# Patient Record
Sex: Female | Born: 1985
Health system: Southern US, Community
[De-identification: ages and names within clinical notes are randomized; demographics above are authoritative.]

## PROBLEM LIST (undated history)

## (undated) DIAGNOSIS — A749 Chlamydial infection, unspecified: Secondary | ICD-10-CM

## (undated) DIAGNOSIS — Z789 Other specified health status: Secondary | ICD-10-CM

## (undated) HISTORY — PX: ECTOPIC PREGNANCY SURGERY: SHX613

## (undated) HISTORY — PX: IUD REMOVAL: SHX5392

## (undated) HISTORY — PX: TUBAL LIGATION: SHX77

---
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
## 2005-06-28 HISTORY — PX: TUBAL LIGATION: SHX77

## 2011-05-17 ENCOUNTER — Emergency Department (HOSPITAL_COMMUNITY): Payer: Self-pay

## 2011-05-17 ENCOUNTER — Emergency Department (HOSPITAL_COMMUNITY)
Admission: EM | Admit: 2011-05-17 | Discharge: 2011-05-17 | Disposition: A | Discharge status: Home / Self Care | Payer: Self-pay | Attending: Emergency Medicine | Admitting: Emergency Medicine

## 2011-05-17 DIAGNOSIS — W1789XA Other fall from one level to another, initial encounter: Secondary | ICD-10-CM | POA: Insufficient documentation

## 2011-05-17 DIAGNOSIS — S93401A Sprain of unspecified ligament of right ankle, initial encounter: Secondary | ICD-10-CM

## 2011-05-17 DIAGNOSIS — S93409A Sprain of unspecified ligament of unspecified ankle, initial encounter: Secondary | ICD-10-CM | POA: Insufficient documentation

## 2011-05-17 DIAGNOSIS — M79609 Pain in unspecified limb: Secondary | ICD-10-CM | POA: Insufficient documentation

## 2011-05-17 DIAGNOSIS — M25579 Pain in unspecified ankle and joints of unspecified foot: Secondary | ICD-10-CM | POA: Insufficient documentation

## 2011-05-17 MED ORDER — KETOROLAC TROMETHAMINE 60 MG/2ML IM SOLN
60.0000 mg | Freq: Once | INTRAMUSCULAR | Status: AC
  Administered 2011-05-17: 60 mg via INTRAMUSCULAR
  Filled 2011-05-17: qty 2

## 2011-05-17 MED ORDER — IBUPROFEN 800 MG PO TABS: 800.0000 mg | ORAL_TABLET | Freq: Three times a day (TID) | ORAL | Status: AC

## 2011-05-17 MED ORDER — HYDROCODONE-ACETAMINOPHEN 5-325 MG PO TABS: 2.0000 | ORAL_TABLET | ORAL | Status: AC | PRN

## 2011-05-17 MED ORDER — KETOROLAC TROMETHAMINE 60 MG/2ML IM SOLN
INTRAMUSCULAR | Status: AC
  Administered 2011-05-17: 60 mg via INTRAMUSCULAR
  Filled 2011-05-17: qty 2

## 2011-05-17 NOTE — Progress Notes (Signed)
Orthopedic Tech Progress Note Patient Details:  Rose Hunt 07/04/1985 [id number]  Other Ortho Devices Type of Ortho Device: ASO;Crutches   [name] 05/17/2011, 11:09 AM

## 2011-05-17 NOTE — ED Provider Notes (Signed)
History     CSN: [id number] Arrival date & time: 05/17/2011  9:24 AM   First MD Initiated Contact with Patient 05/17/11 [phone]      Chief Complaint  Patient presents with   Foot Pain    rt. foot pain, fell off the bus this morning   HPI Patient seen and evaluated at 10 am. Patient presents to emergency room with complaints of right ankle pain. Reports that this started when she was walking off the bus this morning. Patient states that she was able to walk with pain. Patient denies any other injuries. Patient denies any loss of consciousness. Patient denies any numbness or weakness.  History reviewed. No pertinent past medical history.  History reviewed. No pertinent past surgical history.  Family History  Problem Relation Age of Onset   Diabetes Mother    Hypertension Mother     History  Substance Use Topics   Smoking status: Current Everyday Smoker   Smokeless tobacco: Never Used   Alcohol Use: Yes    OB History    Grav Para Term Preterm Abortions TAB SAB Ect Mult Living                  Review of Systems  Constitutional: Negative for fever, chills, diaphoresis and appetite change.  HENT: Negative for neck pain.   Eyes: Negative for photophobia and visual disturbance.  Respiratory: Negative for cough, chest tightness and shortness of breath.   Cardiovascular: Negative for chest pain.  Gastrointestinal: Negative for nausea, vomiting and abdominal pain.  Genitourinary: Negative for flank pain.  Musculoskeletal: Negative for back pain.  Skin: Negative for rash.  Neurological: Negative for weakness and numbness.  All other systems reviewed and are negative.    Allergies  Review of patient's allergies indicates no known allergies.  Home Medications   Current Outpatient Rx  Name Route Sig Dispense Refill   ACETAMINOPHEN 160 MG PO TABS Oral Take 160 mg by mouth every 6 (six) hours as needed. For pain      BP 115/72   Pulse 87   Temp(Src) 98.3 F (36.8  C) (Oral)   Resp 20   Ht 5\' 4"  (1.626 m)   Wt 240 lb (108.863 kg)   BMI 41.20 kg/m2   SpO2 99%   LMP 05/03/2011  Physical Exam  Nursing note and vitals reviewed. Constitutional: She is oriented to person, place, and time. She appears well-developed and well-nourished. No distress.  HENT:  Head: Normocephalic and atraumatic.  Eyes: EOM are normal. Pupils are equal, round, and reactive to light.  Neck: Normal range of motion. Neck supple.  Cardiovascular: Normal rate and regular rhythm.   Pulmonary/Chest: Effort normal and breath sounds normal.  Abdominal: Soft. Bowel sounds are normal. There is no tenderness.  Musculoskeletal:       Right ankle: She exhibits normal range of motion, no swelling, no deformity and normal pulse. tenderness. Lateral malleolus and medial malleolus tenderness found. No CF ligament, no posterior TFL, no head of 5th metatarsal and no proximal fibula tenderness found.  Neurological: She is alert and oriented to person, place, and time. She has normal reflexes. No cranial nerve deficit or sensory deficit. She exhibits normal muscle tone. Coordination and gait normal.  Skin: Skin is warm and dry. She is not diaphoretic. No pallor.  Psychiatric: She has a normal mood and affect. Her behavior is normal. Judgment and thought content normal.    ED Course  Procedures (including critical care time)  Patient seen  and evaluated.  VSS reviewed. . Nursing notes reviewed. Will monitor the patient closely. They agree with the treatment plan and diagnosis.   No results found for this or any previous visit. Dg Ankle Complete Right  05/17/2011  *RADIOLOGY REPORT*  Clinical Data: Diffuse ankle and heel pain after falling.  RIGHT ANKLE - COMPLETE 3+ VIEW  Comparison: None.  Findings: The mineralization and alignment are normal.  There is no evidence of acute fracture or dislocation.  No focal soft tissue swelling is evident.  The joint spaces are preserved.  IMPRESSION: No acute  osseous findings.  Original Report Authenticated By: Gerrianne Scale, M.D.    10:47 AM Patient seen and re-evaluated. Resting comfortably. VSS stable. NAD. Patient notified of testing results. Stated agreement and understanding. Patient stated understanding to treatment plan and diagnosis. ASO and crutches     MDM  Ankle sprain        Demetrius Charity, PA 05/17/11 1048

## 2011-05-17 NOTE — ED Notes (Signed)
Pt. Larey Seat off the bus this am, and is having rt. Foot pain into rt. Ankle. No deformity noted slightly swollen +CNS

## 2015-01-14 ENCOUNTER — Emergency Department (HOSPITAL_BASED_OUTPATIENT_CLINIC_OR_DEPARTMENT_OTHER)
Admission: EM | Admit: 2015-01-14 | Discharge: 2015-01-14 | Disposition: A | Discharge status: Home / Self Care | Payer: Self-pay | Attending: Emergency Medicine | Admitting: Emergency Medicine

## 2015-01-14 DIAGNOSIS — Z3202 Encounter for pregnancy test, result negative: Secondary | ICD-10-CM | POA: Insufficient documentation

## 2015-01-14 DIAGNOSIS — A5901 Trichomonal vulvovaginitis: Secondary | ICD-10-CM | POA: Insufficient documentation

## 2015-01-14 DIAGNOSIS — R1030 Lower abdominal pain, unspecified: Secondary | ICD-10-CM

## 2015-01-14 DIAGNOSIS — B9689 Other specified bacterial agents as the cause of diseases classified elsewhere: Secondary | ICD-10-CM

## 2015-01-14 DIAGNOSIS — Z720 Tobacco use: Secondary | ICD-10-CM | POA: Insufficient documentation

## 2015-01-14 DIAGNOSIS — N760 Acute vaginitis: Secondary | ICD-10-CM | POA: Insufficient documentation

## 2015-01-14 DIAGNOSIS — R110 Nausea: Secondary | ICD-10-CM | POA: Insufficient documentation

## 2015-01-14 LAB — URINALYSIS, ROUTINE W REFLEX MICROSCOPIC
Bilirubin Urine: NEGATIVE
GLUCOSE, UA: NEGATIVE mg/dL
Hgb urine dipstick: NEGATIVE
KETONES UR: NEGATIVE mg/dL
NITRITE: NEGATIVE
PROTEIN: NEGATIVE mg/dL
Specific Gravity, Urine: 1.022 (ref 1.005–1.030)
UROBILINOGEN UA: 0.2 mg/dL (ref 0.0–1.0)
pH: 6.5 (ref 5.0–8.0)

## 2015-01-14 LAB — URINE MICROSCOPIC-ADD ON

## 2015-01-14 LAB — WET PREP, GENITAL: YEAST WET PREP: NONE SEEN

## 2015-01-14 LAB — PREGNANCY, URINE: PREG TEST UR: NEGATIVE

## 2015-01-14 MED ORDER — KETOROLAC TROMETHAMINE 60 MG/2ML IM SOLN
60.0000 mg | Freq: Once | INTRAMUSCULAR | Status: AC
  Administered 2015-01-14: 60 mg via INTRAMUSCULAR
  Filled 2015-01-14: qty 2

## 2015-01-14 MED ORDER — METRONIDAZOLE 500 MG PO TABS
500.0000 mg | ORAL_TABLET | Freq: Two times a day (BID) | ORAL | Status: DC
Start: 2015-01-14 — End: 2015-02-04

## 2015-01-14 NOTE — ED Notes (Signed)
Lower abdominal pain. Menses is late.

## 2015-01-14 NOTE — ED Provider Notes (Signed)
CSN: [id number]     Arrival date & time 01/14/15  1312 History   First MD Initiated Contact with Patient 01/14/15 1345     Chief Complaint  Patient presents with  . Abdominal Pain     (Consider location/radiation/quality/duration/timing/severity/associated sxs/prior Treatment) HPI Comments: 29 year old female complaining of lower abdominal pain 1 month, worsening over the past week. Pain is described as cramping, with occasional episodes of sharp pain that come and go at random, nonradiating rated 8/10. Tried taking over-the-counter nighttime medication yesterday evening with no relief. States she is about one week late on her menstrual cycle. LMP 12/03/2014. History of tubal ligation. Admits to a small amount of vaginal discharge and increased urinary frequency. Denies dysuria, hematuria or vaginal bleeding. Denies fevers. Admits to nausea without vomiting.  Patient is a 29 y.o. female presenting with abdominal pain. The history is provided by the patient.  Abdominal Pain Associated symptoms: nausea and vaginal discharge     No past medical history on file. No past surgical history on file. Family History  Problem Relation Age of Onset  . Diabetes Mother   . Hypertension Mother    History  Substance Use Topics  . Smoking status: Current Every Day Smoker  . Smokeless tobacco: Never Used  . Alcohol Use: Yes   OB History    No data available     Review of Systems  Gastrointestinal: Positive for nausea and abdominal pain.  Genitourinary: Positive for frequency and vaginal discharge.  All other systems reviewed and are negative.     Allergies  Review of patient's allergies indicates no known allergies.  Home Medications   Prior to Admission medications   Medication Sig Start Date End Date Taking? Authorizing Provider  acetaminophen (TYLENOL) 160 MG tablet Take 160 mg by mouth every 6 (six) hours as needed. For pain    Historical Provider, MD  metroNIDAZOLE (FLAGYL) 500  MG tablet Take 1 tablet (500 mg total) by mouth 2 (two) times daily. One po bid x 7 days 01/14/15   Nada Boozer Tawonda Legaspi, PA-C   BP 119/80 mmHg  Pulse 86  Temp(Src) 99.3 F (37.4 C) (Oral)  Resp 20  Ht  (1.626 m)  Wt 240 lb (108.863 kg)  BMI 41.18 kg/m2  SpO2 100%  LMP 12/03/2014 Physical Exam  Constitutional: She is oriented to person, place, and time. She appears well-developed and well-nourished. No distress.  HENT:  Head: Normocephalic and atraumatic.  Mouth/Throat: Oropharynx is clear and moist.  Eyes: Conjunctivae and EOM are normal.  Neck: Normal range of motion. Neck supple.  Cardiovascular: Normal rate, regular rhythm and normal heart sounds.   Pulmonary/Chest: Effort normal and breath sounds normal. No respiratory distress.  Abdominal: Soft. Normal appearance and bowel sounds are normal. She exhibits no distension. There is tenderness in the suprapubic area. There is no rigidity, no rebound, no guarding and no CVA tenderness.  No peritoneal signs.  Genitourinary: Uterus is tender. Cervix exhibits no motion tenderness, no discharge and no friability. Right adnexum displays no mass and no tenderness. Left adnexum displays no mass and no tenderness. No tenderness or bleeding in the vagina. Vaginal discharge (white) found.  Musculoskeletal: Normal range of motion. She exhibits no edema.  Neurological: She is alert and oriented to person, place, and time. No sensory deficit.  Skin: Skin is warm and dry.  Psychiatric: She has a normal mood and affect. Her behavior is normal.  Nursing note and vitals reviewed.   ED Course  Procedures (including  critical care time) Labs Review Labs Reviewed  WET PREP, GENITAL - Abnormal; Notable for the following:    Trich, Wet Prep MANY (*)    Clue Cells Wet Prep HPF POC MODERATE (*)    WBC, Wet Prep HPF POC MODERATE (*)    All other components within normal limits  URINALYSIS, ROUTINE W REFLEX MICROSCOPIC (NOT AT [facility]) - Abnormal; Notable for  the following:    Leukocytes, UA SMALL (*)    All other components within normal limits  URINE MICROSCOPIC-ADD ON - Abnormal; Notable for the following:    Squamous Epithelial / LPF FEW (*)    Bacteria, UA FEW (*)    All other components within normal limits  PREGNANCY, URINE  GC/CHLAMYDIA PROBE AMP (Bivalve) NOT AT [facility]    Imaging Review No results found.   EKG Interpretation None      MDM   Final diagnoses:  Trichomonas vaginitis  Suprapubic abdominal pain, unspecified laterality   Nontoxic appearing, NAD. Afebrile. Vital signs stable. On chart review, patient was seen 2 months ago at [facility] for the same, at that time had a pelvic ultrasound confirming a 9 mm uterine fibroid, and she was given a shot of Rocephin along with azithromycin in the ED and discharged home with Flagyl and doxycycline. Patient states she did not take the prescribed medications due to cost. She had trichomonas present at the time. Patient states the pain went away for about a month and returned. On exam, patient has a tender uterus. No cervical discharge, CMT or adnexal tenderness. I do not feel repeat ultrasound is necessary today. Many trichomoniasis and moderate clue cells seen on wet prep along with trichomonas present in urine. Will give Rx for Flagyl. I do not feel patient needs prophylactic treatment for GC/chlamydia as she just received this 2 months ago. Cultures pending. Discussed importance of medication compliance. Follow-up with gynecology for evaluation of the uterine fibroid. Stable for discharge. Return precautions given. Patient states understanding of treatment care plan and is agreeable.  Kathrynn SpeedRobyn M Hilaria Titsworth, PA-C 01/14/15 1509  Jerelyn ScottMartha Linker, MD 01/15/15 [phone]

## 2015-01-14 NOTE — Discharge Instructions (Signed)
Trichomonas is a sexually transmitted disease. You're obligated to inform your partner so that he can receive treatment. Take Flagyl twice daily for 1 week as directed. It is important to complete the entire course of antibiotic.  Trichomoniasis Trichomoniasis is an infection caused by an organism called Trichomonas. The infection can affect both women and men. In women, the outer female genitalia and the vagina are affected. In men, the penis is mainly affected, but the prostate and other reproductive organs can also be involved. Trichomoniasis is a sexually transmitted infection (STI) and is most often passed to another person through sexual contact.  RISK FACTORS  Having unprotected sexual intercourse.  Having sexual intercourse with an infected partner. SIGNS AND SYMPTOMS  Symptoms of trichomoniasis in women include:  Abnormal gray-green frothy vaginal discharge.  Itching and irritation of the vagina.  Itching and irritation of the area outside the vagina. Symptoms of trichomoniasis in men include:   Penile discharge with or without pain.  Pain during urination. This results from inflammation of the urethra. DIAGNOSIS  Trichomoniasis may be found during a Pap test or physical exam. Your health care provider may use one of the following methods to help diagnose this infection:  Examining vaginal discharge under a microscope. For men, urethral discharge would be examined.  Testing the pH of the vagina with a test tape.  Using a vaginal swab test that checks for the Trichomonas organism. A test is available that provides results within a few minutes.  Doing a culture test for the organism. This is not usually needed. TREATMENT   You may be given medicine to fight the infection. Women should inform their health care provider if they could be or are pregnant. Some medicines used to treat the infection should not be taken during pregnancy.  Your health care provider may recommend  over-the-counter medicines or creams to decrease itching or irritation.  Your sexual partner will need to be treated if infected. HOME CARE INSTRUCTIONS   Take medicines only as directed by your health care provider.  Take over-the-counter medicine for itching or irritation as directed by your health care provider.  Do not have sexual intercourse while you have the infection.  Women should not douche or wear tampons while they have the infection.  Discuss your infection with your partner. Your partner may have gotten the infection from you, or you may have gotten it from your partner.  Have your sex partner get examined and treated if necessary.  Practice safe, informed, and protected sex.  See your health care provider for other STI testing. SEEK MEDICAL CARE IF:   You still have symptoms after you finish your medicine.  You develop abdominal pain.  You have pain when you urinate.  You have bleeding after sexual intercourse.  You develop a rash.  Your medicine makes you sick or makes you throw up (vomit). MAKE SURE YOU:  Understand these instructions.  Will watch your condition.  Will get help right away if you are not doing well or get worse. Document Released: 12/08/2000 Document Revised: 10/29/2013 Document Reviewed: 03/26/2013 [facility] Patient Information 2015 [employer] [place]. This information is not intended to replace advice given to you by your health care provider. Make sure you discuss any questions you have with your health care provider.

## 2015-01-14 NOTE — ED Notes (Signed)
PA at bedside.

## 2015-01-15 LAB — GC/CHLAMYDIA PROBE AMP (~~LOC~~) NOT AT ARMC
Chlamydia: NEGATIVE
Neisseria Gonorrhea: NEGATIVE

## 2015-02-03 ENCOUNTER — Ambulatory Visit (INDEPENDENT_AMBULATORY_CARE_PROVIDER_SITE_OTHER): Payer: Self-pay | Admitting: Obstetrics & Gynecology

## 2015-02-03 ENCOUNTER — Encounter: Payer: Self-pay | Admitting: Obstetrics & Gynecology

## 2015-02-03 VITALS — BP 120/90 | HR 89 | Ht 64.0 in | Wt 229.8 lb

## 2015-02-03 DIAGNOSIS — N898 Other specified noninflammatory disorders of vagina: Secondary | ICD-10-CM

## 2015-02-03 DIAGNOSIS — B9689 Other specified bacterial agents as the cause of diseases classified elsewhere: Secondary | ICD-10-CM

## 2015-02-03 DIAGNOSIS — A499 Bacterial infection, unspecified: Secondary | ICD-10-CM

## 2015-02-03 DIAGNOSIS — N760 Acute vaginitis: Secondary | ICD-10-CM

## 2015-02-03 NOTE — Progress Notes (Signed)
   CLINIC ENCOUNTER NOTE  History:  29 y.o. [id number] here today for follow up after being evaluated at [facility] on 01/14/15 for pain.  She had recently been treated for Trichomonas at [facility] and PID.  The PA who saw her in the [facility] alluded to ultrasound done at [facility] what showed 9 mm fibroid; patient was sent here for follow up.  Of note, repeat wet prep showed Trichomonas, and she was retreated with Metronidazole 500 mg po bid x 7 days.  She denies any abnormal vaginal bleeding, pelvic pain but reports continued abnormal discharge.  History reviewed. No pertinent past medical history.  Past Surgical History  Procedure Laterality Date  . Tubal ligation  2007   The following portions of the patient's history were reviewed and updated as appropriate: allergies, current medications, past family history, past medical history, past social history, past surgical history and problem list.   Health Maintenance:  Normal pap in 2015.  Review of Systems:  Pertinent items are noted in HPI. Comprehensive review of systems was otherwise negative.  Objective:  Physical Exam BP 120/90 mmHg  Pulse 89  Ht  (1.626 m)  Wt 229 lb 12.8 oz (104.237 kg)  BMI 39.43 kg/m2  LMP 01/07/2015 CONSTITUTIONAL: Well-developed, well-nourished female in no acute distress.  HENT:  Normocephalic, atraumatic. External right and left ear normal. Oropharynx is clear and moist EYES: Conjunctivae and EOM are normal. Pupils are equal, round, and reactive to light. No scleral icterus.  NECK: Normal range of motion, supple, no masses SKIN: Skin is warm and dry. No rash noted. Not diaphoretic. No erythema. No pallor. NEUROLGIC: Alert and oriented to person, place, and time. Normal reflexes, muscle tone coordination. No cranial nerve deficit noted. PSYCHIATRIC: Normal mood and affect. Normal behavior. Normal judgment and thought content. CARDIOVASCULAR: Normal heart rate noted RESPIRATORY: Effort and  breath sounds normal, no problems with respiration noted ABDOMEN: Soft, no distention noted.   PELVIC: Normal appearing external genitalia; normal appearing vaginal mucosa and cervix.  White discharge noted, wet prep obtained.  Normal uterine size, no other palpable masses, no uterine or adnexal tenderness. MUSCULOSKELETAL: Normal range of motion. No edema noted.  Labs and Imaging Wet prep pending  Assessment & Plan:  Will follow up results of wet prep and manage accordingly. Reassured about 9 mm fibroid, no need for intervention now.  Routine preventative health maintenance measures emphasized. Please refer to After Visit Summary for other counseling recommendations.     Jaynie Collins, MD, FACOG Attending Obstetrician & Gynecologist Center for [employer], [facility]

## 2015-02-03 NOTE — Patient Instructions (Signed)
Return to clinic for any scheduled appointments or for any gynecologic concerns as needed.   

## 2015-02-04 ENCOUNTER — Telehealth: Payer: Self-pay | Admitting: *Deleted

## 2015-02-04 DIAGNOSIS — B9689 Other specified bacterial agents as the cause of diseases classified elsewhere: Secondary | ICD-10-CM

## 2015-02-04 DIAGNOSIS — N760 Acute vaginitis: Principal | ICD-10-CM

## 2015-02-04 LAB — WET PREP, GENITAL
Trich, Wet Prep: NONE SEEN
YEAST WET PREP: NONE SEEN

## 2015-02-04 MED ORDER — METRONIDAZOLE 500 MG PO TABS: 500.0000 mg | ORAL_TABLET | Freq: Two times a day (BID) | ORAL | Status: DC

## 2015-02-04 NOTE — Addendum Note (Signed)
Addended by: Jaynie Collins A on: 02/04/2015 08:19 AM   Modules accepted: Orders

## 2015-02-04 NOTE — Telephone Encounter (Signed)
Pt has bacterial vaginitis, prescription ordered by Dr. Macon Large.  Contacted patient, results given and recommendation for medical treatment. Pt states her pharmacy needs to be changed to [facility] on [address]. [place].

## 2015-12-23 ENCOUNTER — Emergency Department (INDEPENDENT_AMBULATORY_CARE_PROVIDER_SITE_OTHER)
Admission: EM | Admit: 2015-12-23 | Discharge: 2015-12-23 | Disposition: A | Discharge status: Home / Self Care | Payer: Self-pay | Source: Home / Self Care | Attending: Family Medicine | Admitting: Family Medicine

## 2015-12-23 ENCOUNTER — Encounter: Payer: Self-pay | Admitting: *Deleted

## 2015-12-23 DIAGNOSIS — S39012A Strain of muscle, fascia and tendon of lower back, initial encounter: Secondary | ICD-10-CM

## 2015-12-23 MED ORDER — ACETAMINOPHEN 325 MG PO TABS
650.0000 mg | ORAL_TABLET | Freq: Once | ORAL | Status: AC
  Administered 2015-12-23: 650 mg via ORAL

## 2015-12-23 MED ORDER — CYCLOBENZAPRINE HCL 10 MG PO TABS: 10.0000 mg | ORAL_TABLET | Freq: Two times a day (BID) | ORAL | Status: DC | PRN

## 2015-12-23 MED ORDER — MELOXICAM 7.5 MG PO TABS: 7.5000 mg | ORAL_TABLET | Freq: Every day | ORAL | Status: DC

## 2015-12-23 NOTE — Discharge Instructions (Signed)
°  Meloxicam (Mobic) is an antiinflammatory to help with pain and inflammation.  Do not take ibuprofen, Advil, Aleve, or any other medications that contain NSAIDs while taking meloxicam as this may cause stomach upset or even ulcers if taken in large amounts for an extended period of time.  ° °

## 2015-12-23 NOTE — ED Provider Notes (Signed)
CSN: [id number]     Arrival date & time 12/23/15  0910 History   First MD Initiated Contact with Patient 12/23/15 0919     Chief Complaint  Patient presents with  . Back Pain   (Consider location/radiation/quality/duration/timing/severity/associated sxs/prior Treatment) HPI  Rose Hunt is a 30 y.o. female presenting to UC with c/o sudden onset mid-lower back pain around 0800 as she was helping a coworker lift and transfer a patient. She has been working this current job for about 8-9 months. Denies prior back injuries or surgeries. Current pain is aching and sore, 7/10 at this time, worse with certain movements and in certain positions.  She has not taken anything PTA.  Denies numbness, tingling or pain in arms or legs.   History reviewed. No pertinent past medical history. Past Surgical History  Procedure Laterality Date  . Tubal ligation  2007   Family History  Problem Relation Age of Onset  . Diabetes Mother   . Hypertension Mother    Social History  Substance Use Topics  . Smoking status: Current Some Day Smoker    Types: Cigarettes  . Smokeless tobacco: Never Used     Comment: 1-2 black and mild cigars q wk  . Alcohol Use: Yes   OB History    Gravida Para Term Preterm AB TAB SAB Ectopic Multiple Living   3 2 2  1 1    2      Review of Systems  Musculoskeletal: Positive for myalgias and back pain. Negative for joint swelling, arthralgias, gait problem, neck pain and neck stiffness.  Skin: Negative for color change and wound.  Neurological: Negative for weakness and numbness.    Allergies  Review of patient's allergies indicates no known allergies.  Home Medications   Prior to Admission medications   Medication Sig Start Date End Date Taking? Authorizing Provider  cyclobenzaprine (FLEXERIL) 10 MG tablet Take 1 tablet (10 mg total) by mouth 2 (two) times daily as needed for muscle spasms. 12/23/15   Junius FinnerErin O'Malley, PA-C  meloxicam (MOBIC) 7.5 MG tablet Take 1-2  tablets (7.5-15 mg total) by mouth daily. For 5 days, then daily as needed for pain 12/23/15   Junius FinnerErin O'Malley, PA-C   Meds Ordered and Administered this Visit   Medications  acetaminophen (TYLENOL) tablet 650 mg (650 mg Oral Given 12/23/15 0925)    BP 117/85 mmHg  Pulse 100  Temp(Src) 98 F (36.7 C) (Oral)  Resp 18  Ht 5\' 4"  (1.626 m)  Wt 240 lb (108.863 kg)  BMI 41.18 kg/m2  SpO2 100%  LMP 12/18/2015 No data found.   Physical Exam  Constitutional: She is oriented to person, place, and time. She appears well-developed and well-nourished.  Sitting comfortably in exam chair, NAD  HENT:  Head: Normocephalic and atraumatic.  Eyes: EOM are normal.  Neck: Normal range of motion. Neck supple.  No midline bone tenderness, no crepitus or step-offs.  Cardiovascular: Normal rate, regular rhythm and normal heart sounds.   Pulmonary/Chest: Effort normal and breath sounds normal. No respiratory distress. She has no wheezes. She has no rales.  Musculoskeletal: Normal range of motion. She exhibits tenderness. She exhibits no edema.  Tenderness to thoracic muscles and bilateral thoracic and lumbar paraspinal muscles and mild tenderness to lumbar spine.  Negative straight leg raise. 5/5 strength in upper and lower extremities bilaterally. Normal gait.   Neurological: She is alert and oriented to person, place, and time.  Skin: Skin is warm and dry. No rash noted. No  erythema.  Psychiatric: She has a normal mood and affect. Her behavior is normal.  Nursing note and vitals reviewed.   ED Course  Procedures (including critical care time)  Labs Review Labs Reviewed - No data to display  Imaging Review No results found.   MDM   1. Back strain, initial encounter    Pt c/o back pain after helping a coworker lift and transfer a patient. No falls. No red flag symptoms.  Tenderness to muscles and mild tenderness to lumbar spine with negative straight leg raise. Will hold off on imaging at this  time and treat symptomatically for muscle strain.  Rx: Meloxicam and Flexeril  Encouraged to use ice today and tomorrow then change to heating back or warm washcloths. Home care instructions provided. F/u with PCP or Sports Medicine in 1 week if not improving. Patient verbalized understanding and agreement with treatment plan.     Junius FinnerErin O'Malley, PA-C 12/23/15 [phone]

## 2015-12-23 NOTE — ED Notes (Signed)
Pt c/o mid back pain and rib area pain bilaterally x 0800 today after helping a coworker lift a pt. No OTC meds.

## 2016-01-19 ENCOUNTER — Encounter (HOSPITAL_BASED_OUTPATIENT_CLINIC_OR_DEPARTMENT_OTHER): Payer: Self-pay

## 2016-01-19 ENCOUNTER — Emergency Department (HOSPITAL_BASED_OUTPATIENT_CLINIC_OR_DEPARTMENT_OTHER)
Admission: EM | Admit: 2016-01-19 | Discharge: 2016-01-19 | Disposition: A | Discharge status: Home / Self Care | Payer: Self-pay | Attending: Emergency Medicine | Admitting: Emergency Medicine

## 2016-01-19 DIAGNOSIS — N760 Acute vaginitis: Secondary | ICD-10-CM | POA: Insufficient documentation

## 2016-01-19 DIAGNOSIS — R1030 Lower abdominal pain, unspecified: Secondary | ICD-10-CM

## 2016-01-19 DIAGNOSIS — F17210 Nicotine dependence, cigarettes, uncomplicated: Secondary | ICD-10-CM | POA: Insufficient documentation

## 2016-01-19 DIAGNOSIS — N72 Inflammatory disease of cervix uteri: Secondary | ICD-10-CM | POA: Insufficient documentation

## 2016-01-19 DIAGNOSIS — B9689 Other specified bacterial agents as the cause of diseases classified elsewhere: Secondary | ICD-10-CM

## 2016-01-19 DIAGNOSIS — A599 Trichomoniasis, unspecified: Secondary | ICD-10-CM | POA: Insufficient documentation

## 2016-01-19 LAB — URINE MICROSCOPIC-ADD ON

## 2016-01-19 LAB — URINALYSIS, ROUTINE W REFLEX MICROSCOPIC
Glucose, UA: NEGATIVE mg/dL
Hgb urine dipstick: NEGATIVE
KETONES UR: 15 mg/dL — AB
NITRITE: NEGATIVE
PH: 6 (ref 5.0–8.0)
Protein, ur: NEGATIVE mg/dL
SPECIFIC GRAVITY, URINE: 1.027 (ref 1.005–1.030)

## 2016-01-19 LAB — WET PREP, GENITAL
Sperm: NONE SEEN
Yeast Wet Prep HPF POC: NONE SEEN

## 2016-01-19 LAB — PREGNANCY, URINE: PREG TEST UR: NEGATIVE

## 2016-01-19 MED ORDER — METRONIDAZOLE 500 MG PO TABS
2000.0000 mg | ORAL_TABLET | Freq: Once | ORAL | Status: AC
  Administered 2016-01-19: 2000 mg via ORAL
  Filled 2016-01-19: qty 4

## 2016-01-19 MED ORDER — CEFTRIAXONE SODIUM 250 MG IJ SOLR
250.0000 mg | Freq: Once | INTRAMUSCULAR | Status: AC
  Administered 2016-01-19: 250 mg via INTRAMUSCULAR
  Filled 2016-01-19: qty 250

## 2016-01-19 MED ORDER — ONDANSETRON 4 MG PO TBDP
4.0000 mg | ORAL_TABLET | Freq: Once | ORAL | Status: AC
  Administered 2016-01-19: 4 mg via ORAL
  Filled 2016-01-19: qty 1

## 2016-01-19 MED ORDER — AZITHROMYCIN 250 MG PO TABS
1000.0000 mg | ORAL_TABLET | Freq: Once | ORAL | Status: AC
  Administered 2016-01-19: 1000 mg via ORAL
  Filled 2016-01-19: qty 4

## 2016-01-19 MED ORDER — DOXYCYCLINE HYCLATE 100 MG PO CAPS: 100.0000 mg | ORAL_CAPSULE | Freq: Two times a day (BID) | ORAL | 0 refills | Status: DC

## 2016-01-19 MED ORDER — METRONIDAZOLE 500 MG PO TABS: 500.0000 mg | ORAL_TABLET | Freq: Two times a day (BID) | ORAL | 0 refills | Status: DC

## 2016-01-19 MED FILL — metroNIDAZOLE 500 MG TABS: 500 | 7 days supply | Qty: 14 | Fill #0

## 2016-01-19 MED FILL — DOXYCYCLINE HYC 100 MG CAP: 100 | 14 days supply | Qty: 28 | Fill #0

## 2016-01-19 NOTE — ED Triage Notes (Signed)
C/o lower abd pain x 1 week-"itching and burning"-slight vaginal d/c-NAD-steady gsit

## 2016-01-19 NOTE — ED Provider Notes (Signed)
[facility] Provider Note   CSN: [id number] Arrival date & time: 01/19/16  1115  First Provider Contact:  First MD Initiated Contact with Patient 01/19/16 1130        History   Chief Complaint Chief Complaint  Patient presents with  . Abdominal Pain    HPI Rose Hunt is a 30 y.o. female.  Patient with past medical history for uterine fibroids, PID, trichomoniasis presents with 1 week of intermittent, mild suprapubic abdominal pain she describes as cramping. Associated symptoms include dysuria, vaginal itching and burning, and white vaginal discharge. She denies any fever, chills, nausea, vomiting, hematuria, diarrhea. LNMP 12/21/2015. She denies any vaginal bleeding. She states that this feels similar to when she had trichomoniasis in the past.   The history is provided by the patient.  Abdominal Pain   This is a new problem. The current episode started more than 2 days ago. The problem has not changed since onset.The pain is located in the suprapubic region. The pain is mild. Associated symptoms include dysuria. Pertinent negatives include diarrhea, nausea, vomiting, frequency and hematuria. Nothing aggravates the symptoms. Nothing relieves the symptoms.    History reviewed. No pertinent past medical history.  There are no active problems to display for this patient.   Past Surgical History:  Procedure Laterality Date  . TUBAL LIGATION  2007  . TUBAL LIGATION      OB History    Gravida Para Term Preterm AB Living   SAB TAB Ectopic Multiple Live Births     1             Home Medications    Prior to Admission medications   Medication Sig Start Date End Date Taking? Authorizing Provider  cyclobenzaprine (FLEXERIL) 10 MG tablet Take 1 tablet (10 mg total) by mouth 2 (two) times daily as needed for muscle spasms. 12/23/15   Junius Finner, PA-C  doxycycline (VIBRAMYCIN) 100 MG capsule Take 1 capsule (100 mg total) by mouth 2 (two) times  daily. 01/19/16   Cheri Fowler, PA-C  meloxicam (MOBIC) 7.5 MG tablet Take 1-2 tablets (7.5-15 mg total) by mouth daily. For 5 days, then daily as needed for pain 12/23/15   Junius Finner, PA-C  metroNIDAZOLE (FLAGYL) 500 MG tablet Take 1 tablet (500 mg total) by mouth 2 (two) times daily. 01/19/16   Cheri Fowler, PA-C    Family History Family History  Problem Relation Age of Onset  . Diabetes Mother   . Hypertension Mother     Social History Social History  Substance Use Topics  . Smoking status: Current Some Day Smoker    Types: Cigarettes  . Smokeless tobacco: Never Used     Comment: 1-2 black and mild cigars q wk  . Alcohol use Yes     Comment: occ     Allergies   Review of patient's allergies indicates no known allergies.   Review of Systems Review of Systems  Gastrointestinal: Positive for abdominal pain. Negative for diarrhea, nausea and vomiting.  Genitourinary: Positive for dysuria and vaginal discharge. Negative for frequency, hematuria and vaginal bleeding.     Physical Exam Updated Vital Signs BP 126/72 (BP Location: Right Arm)   Pulse 93   Temp 99.4 F (37.4 C) (Oral)   Resp 18   Ht  (1.626 m)   Wt 104.3 kg   LMP 12/18/2015   SpO2 96%   BMI 39.48 kg/m   Physical Exam  Constitutional: She is oriented to person, place, and time. She appears well-developed and well-nourished.  Non-toxic appearance. She does not have a sickly appearance. She does not appear ill.  HENT:  Head: Normocephalic and atraumatic.  Mouth/Throat: Oropharynx is clear and moist.  Eyes: Conjunctivae are normal.  Neck: Normal range of motion. Neck supple.  Cardiovascular: Normal rate and regular rhythm.   Pulmonary/Chest: Effort normal and breath sounds normal. No accessory muscle usage or stridor. No respiratory distress. She has no wheezes. She has no rhonchi. She has no rales.  Abdominal: Soft. Bowel sounds are normal. She exhibits no distension. There is no tenderness. There is  no rebound and no guarding.  Genitourinary: Vagina normal. There is no rash or lesion on the right labia. There is no rash or lesion on the left labia. Uterus is tender (mild). Cervix exhibits motion tenderness (mild). Discharge: moderate white. Right adnexum displays tenderness. Left adnexum displays tenderness.  Musculoskeletal: Normal range of motion.  Lymphadenopathy:    She has no cervical adenopathy.  Neurological: She is alert and oriented to person, place, and time.  Speech clear without dysarthria.  Skin: Skin is warm and dry.  Psychiatric: She has a normal mood and affect. Her behavior is normal.     ED Treatments / Results  Labs (all labs ordered are listed, but only abnormal results are displayed) Labs Reviewed  WET PREP, GENITAL - Abnormal; Notable for the following:       Result Value   Trich, Wet Prep PRESENT (*)    Clue Cells Wet Prep HPF POC PRESENT (*)    WBC, Wet Prep HPF POC MANY (*)    All other components within normal limits  URINALYSIS, ROUTINE W REFLEX MICROSCOPIC (NOT AT [facility]) - Abnormal; Notable for the following:    Color, Urine AMBER (*)    APPearance CLOUDY (*)    Bilirubin Urine SMALL (*)    Ketones, ur 15 (*)    Leukocytes, UA SMALL (*)    All other components within normal limits  URINE MICROSCOPIC-ADD ON - Abnormal; Notable for the following:    Squamous Epithelial / LPF 0-5 (*)    Bacteria, UA RARE (*)    All other components within normal limits  PREGNANCY, URINE  RPR  HIV ANTIBODY (ROUTINE TESTING)  GC/CHLAMYDIA PROBE AMP ([place]) NOT AT [facility]    EKG  EKG Interpretation None       Radiology No results found.  Procedures Procedures (including critical care time)  Medications Ordered in ED Medications  cefTRIAXone (ROCEPHIN) injection 250 mg (250 mg Intramuscular Given 01/19/16 1206)  azithromycin (ZITHROMAX) tablet 1,000 mg (1,000 mg Oral Given 01/19/16 1206)  ondansetron (ZOFRAN-ODT) disintegrating tablet 4 mg (4 mg Oral  Given 01/19/16 1218)  metroNIDAZOLE (FLAGYL) tablet 2,000 mg (2,000 mg Oral Given 01/19/16 1218)     Initial Impression / Assessment and Plan / ED Course  I have reviewed the triage vital signs and the nursing notes.  Pertinent labs & imaging results that were available during my care of the patient were reviewed by me and considered in my medical decision making (see chart for details).  Clinical Course  Value Comment By Time  Robynn Pane Prep: (!) PRESENT Treated with 2g in ED.  Cheri Fowler, PA-C 07/24 1215  WBC, Wet Prep HPF POC: (!) MANY Cervicitis, treated with IM Rocephin and PO Azithromycin.  Given CMT and adnexal tenderness, home with Doxy BID. Cheri Fowler, PA-C 07/24 1216  Clue Cells Wet Prep  HPF POC: (!) PRESENT Home with Flagyl BID.  Cheri Fowler, PA-C 07/24 1216   Patient presents with intermittent cramping suprapubic abdominal pain with associated vaginal discharge over the last week. No systemic symptoms. VSS, NAD. Patient appears well, nontoxic or septic. Heart RRR, lungs CTAP, abdomen soft and benign without rebound, guarding, or rigidity.  Mild CMT and b/l adnexal tenderness.  Moderate white cervical discharge.  She is not pregnant, do not suspect ectopic.  Low suspicion for ovarian torsion.  Low suspicion for TOA.  Likely PID.  Will treat empirically with Rocephin and Azithromycin in ED.  Home with Doxycycline BID x 14 days. UA non-infectious.  Wet prep remarkable for Trich, 2g Flagyl as well as clue cells, home with Flagyl BID.  Follow up GYN.  Return precautions discussed.  Patient agrees and acknowledges the above plan for discharge.   Case has been discussed with Dr. Fayrene Fearing who agrees with the above plan for discharge.    Final Clinical Impressions(s) / ED Diagnoses   Final diagnoses:  Trichomoniasis  Cervicitis  Lower abdominal pain  Bacterial vaginosis    New Prescriptions New Prescriptions   DOXYCYCLINE (VIBRAMYCIN) 100 MG CAPSULE    Take 1 capsule (100 mg total) by  mouth 2 (two) times daily.   METRONIDAZOLE (FLAGYL) 500 MG TABLET    Take 1 tablet (500 mg total) by mouth 2 (two) times daily.     Cheri Fowler, PA-C 01/19/16 1250    Rolland Porter, MD 02/01/16 [phone]

## 2016-01-19 NOTE — ED Notes (Signed)
Provided with crackers and peanut butter to help with taken so much medication at once.

## 2016-01-20 LAB — RPR: RPR Ser Ql: NONREACTIVE

## 2016-01-20 LAB — GC/CHLAMYDIA PROBE AMP (~~LOC~~) NOT AT ARMC
Chlamydia: NEGATIVE
NEISSERIA GONORRHEA: NEGATIVE

## 2016-01-20 LAB — HIV ANTIBODY (ROUTINE TESTING W REFLEX): HIV Screen 4th Generation wRfx: NONREACTIVE

## 2016-10-13 ENCOUNTER — Encounter (HOSPITAL_BASED_OUTPATIENT_CLINIC_OR_DEPARTMENT_OTHER): Payer: Self-pay

## 2016-10-13 ENCOUNTER — Emergency Department (HOSPITAL_BASED_OUTPATIENT_CLINIC_OR_DEPARTMENT_OTHER)
Admission: EM | Admit: 2016-10-13 | Discharge: 2016-10-13 | Disposition: A | Discharge status: Home / Self Care | Payer: Self-pay | Attending: Emergency Medicine | Admitting: Emergency Medicine

## 2016-10-13 DIAGNOSIS — A599 Trichomoniasis, unspecified: Secondary | ICD-10-CM

## 2016-10-13 DIAGNOSIS — N72 Inflammatory disease of cervix uteri: Secondary | ICD-10-CM

## 2016-10-13 DIAGNOSIS — F17200 Nicotine dependence, unspecified, uncomplicated: Secondary | ICD-10-CM | POA: Insufficient documentation

## 2016-10-13 DIAGNOSIS — A5909 Other urogenital trichomoniasis: Secondary | ICD-10-CM | POA: Insufficient documentation

## 2016-10-13 LAB — URINALYSIS, ROUTINE W REFLEX MICROSCOPIC
BILIRUBIN URINE: NEGATIVE
GLUCOSE, UA: NEGATIVE mg/dL
HGB URINE DIPSTICK: NEGATIVE
KETONES UR: NEGATIVE mg/dL
LEUKOCYTES UA: NEGATIVE
NITRITE: NEGATIVE
PH: 7 (ref 5.0–8.0)
Protein, ur: NEGATIVE mg/dL
Specific Gravity, Urine: 1.021 (ref 1.005–1.030)

## 2016-10-13 LAB — WET PREP, GENITAL
Sperm: NONE SEEN
Yeast Wet Prep HPF POC: NONE SEEN

## 2016-10-13 LAB — PREGNANCY, URINE: PREG TEST UR: NEGATIVE

## 2016-10-13 MED ORDER — CEFTRIAXONE SODIUM 250 MG IJ SOLR
250.0000 mg | Freq: Once | INTRAMUSCULAR | Status: AC
  Administered 2016-10-13: 250 mg via INTRAMUSCULAR
  Filled 2016-10-13: qty 250

## 2016-10-13 MED ORDER — METRONIDAZOLE 500 MG PO TABS: 500.0000 mg | ORAL_TABLET | Freq: Two times a day (BID) | ORAL | 0 refills | Status: DC

## 2016-10-13 MED ORDER — ACETAMINOPHEN 500 MG PO TABS
1000.0000 mg | ORAL_TABLET | Freq: Once | ORAL | Status: AC
  Administered 2016-10-13: 1000 mg via ORAL
  Filled 2016-10-13: qty 2

## 2016-10-13 MED ORDER — OXYCODONE HCL 5 MG PO TABS
5.0000 mg | ORAL_TABLET | Freq: Once | ORAL | Status: AC
  Administered 2016-10-13: 5 mg via ORAL
  Filled 2016-10-13: qty 1

## 2016-10-13 MED ORDER — DOXYCYCLINE HYCLATE 100 MG PO CAPS: 100.0000 mg | ORAL_CAPSULE | Freq: Two times a day (BID) | ORAL | 0 refills | Status: DC

## 2016-10-13 MED ORDER — IBUPROFEN 800 MG PO TABS
800.0000 mg | ORAL_TABLET | Freq: Once | ORAL | Status: AC
  Administered 2016-10-13: 800 mg via ORAL
  Filled 2016-10-13: qty 1

## 2016-10-13 MED ORDER — LIDOCAINE HCL (PF) 1 % IJ SOLN
INTRAMUSCULAR | Status: AC
  Administered 2016-10-13: 5 mL
  Filled 2016-10-13: qty 5

## 2016-10-13 MED ORDER — AZITHROMYCIN 250 MG PO TABS
1000.0000 mg | ORAL_TABLET | Freq: Once | ORAL | Status: AC
Start: 2016-10-13 — End: 2016-10-13
  Administered 2016-10-13: 1000 mg via ORAL
  Filled 2016-10-13: qty 4

## 2016-10-13 MED FILL — DOXYCYCLINE HYC 100 MG CAP: 100 | 7 days supply | Qty: 14 | Fill #0

## 2016-10-13 MED FILL — metroNIDAZOLE 500 MG TABS: 500 | 7 days supply | Qty: 14 | Fill #0

## 2016-10-13 NOTE — Discharge Instructions (Signed)
No sex until 1 week after therapy.  Your partners likely need to be treated too.

## 2016-10-13 NOTE — ED Provider Notes (Signed)
[facility]   CSN: [id number] Arrival date & time: 10/13/16  1318     History   Chief Complaint Chief Complaint  Patient presents with  . Abdominal Pain    HPI Rose Hunt is a 31 y.o. female.  31 yo F with a chief complaint of right lower abdominal pain. Going on for the past month. Started with her menstrual cycle and has persisted. Pain is colicky comes and goes. Denies vaginal bleeding or discharge. Had one episode of vomiting. A couple loose stools but no overt diarrhea. Has a remote history of ovarian cysts. Thinks this may feel the same.   The history is provided by the patient.  Abdominal Pain   This is a new problem. The current episode started less than 1 hour ago. The problem occurs constantly. The problem has not changed since onset.The pain is located in the RLQ. The quality of the pain is sharp and shooting. The pain is at a severity of 6/10. The pain is moderate. Pertinent negatives include fever, nausea, vomiting, dysuria, headaches, arthralgias and myalgias. Nothing aggravates the symptoms. Nothing relieves the symptoms.    History reviewed. No pertinent past medical history.  There are no active problems to display for this patient.   Past Surgical History:  Procedure Laterality Date  . TUBAL LIGATION  2007  . TUBAL LIGATION      OB History    Gravida Para Term Preterm AB Living   SAB TAB Ectopic Multiple Live Births     1             Home Medications    Prior to Admission medications   Medication Sig Start Date End Date Taking? Authorizing Provider  doxycycline (VIBRAMYCIN) 100 MG capsule Take 1 capsule (100 mg total) by mouth 2 (two) times daily. One po bid x 7 days 10/13/16   Melene Plan, DO  metroNIDAZOLE (FLAGYL) 500 MG tablet Take 1 tablet (500 mg total) by mouth 2 (two) times daily. One po bid x 7 days 10/13/16   Melene Plan, DO    Family History Family History  Problem Relation Age of Onset  .  Diabetes Mother   . Hypertension Mother     Social History Social History  Substance Use Topics  . Smoking status: Current Some Day Smoker  . Smokeless tobacco: Never Used  . Alcohol use Yes     Comment: occ     Allergies   Patient has no known allergies.   Review of Systems Review of Systems  Constitutional: Negative for chills and fever.  HENT: Negative for congestion and rhinorrhea.   Eyes: Negative for redness and visual disturbance.  Respiratory: Negative for shortness of breath and wheezing.   Cardiovascular: Negative for chest pain and palpitations.  Gastrointestinal: Positive for abdominal pain. Negative for nausea and vomiting.  Genitourinary: Negative for dysuria and urgency.  Musculoskeletal: Negative for arthralgias and myalgias.  Skin: Negative for pallor and wound.  Neurological: Negative for dizziness and headaches.     Physical Exam Updated Vital Signs BP (!) 153/92   Pulse 86   Temp 98.6 F (37 C) (Oral)   Resp 18   Ht  (1.626 m)   Wt 240 lb (108.9 kg)   LMP 09/28/2016   SpO2 97%   BMI 41.20 kg/m   Physical Exam  Constitutional: She is oriented to person, place, and time. She appears well-developed and well-nourished. No  distress.  HENT:  Head: Normocephalic and atraumatic.  Eyes: EOM are normal. Pupils are equal, round, and reactive to light.  Neck: Normal range of motion. Neck supple.  Cardiovascular: Normal rate and regular rhythm.  Exam reveals no gallop and no friction rub.   No murmur heard. Pulmonary/Chest: Effort normal. She has no wheezes. She has no rales.  Abdominal: Soft. She exhibits no distension and no mass. There is tenderness (very inferior to the RLQ). There is no guarding.  Genitourinary: Cervix exhibits motion tenderness, discharge and friability. Right adnexum displays no mass, no tenderness and no fullness. Left adnexum displays no mass, no tenderness and no fullness.  Musculoskeletal: She exhibits no edema or  tenderness.  Neurological: She is alert and oriented to person, place, and time.  Skin: Skin is warm and dry. She is not diaphoretic.  Psychiatric: She has a normal mood and affect. Her behavior is normal.  Nursing note and vitals reviewed.    ED Treatments / Results  Labs (all labs ordered are listed, but only abnormal results are displayed) Labs Reviewed  WET PREP, GENITAL - Abnormal; Notable for the following:       Result Value   Trich, Wet Prep PRESENT (*)    Clue Cells Wet Prep HPF POC PRESENT (*)    WBC, Wet Prep HPF POC MODERATE (*)    All other components within normal limits  PREGNANCY, URINE  URINALYSIS, ROUTINE W REFLEX MICROSCOPIC  GC/CHLAMYDIA PROBE AMP ([place]) NOT AT [facility]    EKG  EKG Interpretation None       Radiology No results found.  Procedures Procedures (including critical care time)  Medications Ordered in ED Medications  acetaminophen (TYLENOL) tablet 1,000 mg (1,000 mg Oral Given 10/13/16 1448)  ibuprofen (ADVIL,MOTRIN) tablet 800 mg (800 mg Oral Given 10/13/16 1448)  oxyCODONE (Oxy IR/ROXICODONE) immediate release tablet 5 mg (5 mg Oral Given 10/13/16 1448)  cefTRIAXone (ROCEPHIN) injection 250 mg (250 mg Intramuscular Given 10/13/16 1448)  azithromycin (ZITHROMAX) tablet 1,000 mg (1,000 mg Oral Given 10/13/16 1448)  lidocaine (PF) (XYLOCAINE) 1 % injection (5 mLs  Given 10/13/16 1448)     Initial Impression / Assessment and Plan / ED Course  I have reviewed the triage vital signs and the nursing notes.  Pertinent labs & imaging results that were available during my care of the patient were reviewed by me and considered in my medical decision making (see chart for details).     [name] With a chief complaints of right lower abdominal pain. On pelvic exam the patient's pain is overlying the uterus. Is exquisite. Positive chandelier sign. Trichomoniasis on wet prep. Will treat for PID.  3:08 PM:  I have discussed the  diagnosis/risks/treatment options with the patient and family and believe the pt to be eligible for discharge home to follow-up with PCP. We also discussed returning to the ED immediately if new or worsening sx occur. We discussed the sx which are most concerning (e.g., sudden worsening pain, fever, inability to tolerate by mouth) that necessitate immediate return. Medications administered to the patient during their visit and any new prescriptions provided to the patient are listed below.  Medications given during this visit Medications  acetaminophen (TYLENOL) tablet 1,000 mg (1,000 mg Oral Given 10/13/16 1448)  ibuprofen (ADVIL,MOTRIN) tablet 800 mg (800 mg Oral Given 10/13/16 1448)  oxyCODONE (Oxy IR/ROXICODONE) immediate release tablet 5 mg (5 mg Oral Given 10/13/16 1448)  cefTRIAXone (ROCEPHIN) injection 250 mg (250 mg Intramuscular Given 10/13/16  1448)  azithromycin (ZITHROMAX) tablet 1,000 mg (1,000 mg Oral Given 10/13/16 1448)  lidocaine (PF) (XYLOCAINE) 1 % injection (5 mLs  Given 10/13/16 1448)     The patient appears reasonably screen and/or stabilized for discharge and I doubt any other medical condition or other [facility] requiring further screening, evaluation, or treatment in the ED at this time prior to discharge.    Final Clinical Impressions(s) / ED Diagnoses   Final diagnoses:  Trichomoniasis  Cervicitis    New Prescriptions New Prescriptions   DOXYCYCLINE (VIBRAMYCIN) 100 MG CAPSULE    Take 1 capsule (100 mg total) by mouth 2 (two) times daily. One po bid x 7 days   METRONIDAZOLE (FLAGYL) 500 MG TABLET    Take 1 tablet (500 mg total) by mouth 2 (two) times daily. One po bid x 7 days     Melene Plan, DO 10/13/16 1508

## 2016-10-13 NOTE — ED Notes (Signed)
Pt describes the pain as a pelvic cramping on the R side that has been daily, but intermittent x 1 month with acute worsening that caused vomiting x1 today. Pt states the pain in her low back is dull and constant.

## 2016-10-13 NOTE — ED Triage Notes (Signed)
abd pain x 1 month-lower back pain x 2 days-vomited x 1 today-NAD-stead gait

## 2016-10-14 LAB — GC/CHLAMYDIA PROBE AMP (~~LOC~~) NOT AT ARMC
CHLAMYDIA, DNA PROBE: NEGATIVE
NEISSERIA GONORRHEA: NEGATIVE

## 2017-06-24 ENCOUNTER — Emergency Department (HOSPITAL_BASED_OUTPATIENT_CLINIC_OR_DEPARTMENT_OTHER)
Admission: EM | Admit: 2017-06-24 | Discharge: 2017-06-24 | Disposition: A | Discharge status: Home / Self Care | Payer: Self-pay | Attending: Emergency Medicine | Admitting: Emergency Medicine

## 2017-06-24 ENCOUNTER — Encounter (HOSPITAL_BASED_OUTPATIENT_CLINIC_OR_DEPARTMENT_OTHER): Payer: Self-pay | Admitting: *Deleted

## 2017-06-24 ENCOUNTER — Other Ambulatory Visit: Payer: Self-pay

## 2017-06-24 ENCOUNTER — Emergency Department (HOSPITAL_BASED_OUTPATIENT_CLINIC_OR_DEPARTMENT_OTHER): Payer: Self-pay

## 2017-06-24 DIAGNOSIS — B9789 Other viral agents as the cause of diseases classified elsewhere: Secondary | ICD-10-CM

## 2017-06-24 DIAGNOSIS — J069 Acute upper respiratory infection, unspecified: Secondary | ICD-10-CM | POA: Insufficient documentation

## 2017-06-24 DIAGNOSIS — B349 Viral infection, unspecified: Secondary | ICD-10-CM | POA: Insufficient documentation

## 2017-06-24 DIAGNOSIS — F17210 Nicotine dependence, cigarettes, uncomplicated: Secondary | ICD-10-CM | POA: Insufficient documentation

## 2017-06-24 MED ORDER — DEXAMETHASONE 6 MG PO TABS
12.0000 mg | ORAL_TABLET | Freq: Once | ORAL | Status: AC
  Administered 2017-06-24: 12 mg via ORAL
  Filled 2017-06-24: qty 2

## 2017-06-24 MED ORDER — BENZONATATE 100 MG PO CAPS: 100.0000 mg | ORAL_CAPSULE | Freq: Three times a day (TID) | ORAL | 0 refills | Status: DC

## 2017-06-24 MED FILL — BENZONATATE 100 MG CAPSULE: 100 | 7 days supply | Qty: 21 | Fill #0

## 2017-06-24 NOTE — ED Notes (Signed)
ED Provider at bedside. 

## 2017-06-24 NOTE — ED Triage Notes (Signed)
Pt has had a cough and cold for about a month, now her head hurts and she is weak. Nothing is getting better.

## 2017-06-28 NOTE — ED Provider Notes (Signed)
[facility] EMERGENCY DEPARTMENT Provider Note   CSN: [id number] Arrival date & time: 06/24/17  0932     History   Chief Complaint Chief Complaint  Patient presents with  . Cough    HPI Rose Hunt is a 32 y.o. female.  HPI   32 year old female with cough which she describes as cold symptoms."  Symptoms have been going on for several weeks.  She is frustrated she is tried several over-the-counter medications and feels like she is not getting any better.  She does not feel short of breath but she coughs frequently and it keeps her up at night.  She feels generally weak.  No fevers.  She is otherwise healthy.  Smoker.   History reviewed. No pertinent past medical history.  There are no active problems to display for this patient.   Past Surgical History:  Procedure Laterality Date  . TUBAL LIGATION  2007  . TUBAL LIGATION      OB History    Gravida Para Term Preterm AB Living   3 2 2   1 2    SAB TAB Ectopic Multiple Live Births     1             Home Medications    Prior to Admission medications   Medication Sig Start Date End Date Taking? Authorizing Provider  benzonatate (TESSALON) 100 MG capsule Take 1 capsule (100 mg total) by mouth every 8 (eight) hours. 06/24/17   Raeford Razor, MD  doxycycline (VIBRAMYCIN) 100 MG capsule Take 1 capsule (100 mg total) by mouth 2 (two) times daily. One po bid x 7 days 10/13/16   Melene Plan, DO  metroNIDAZOLE (FLAGYL) 500 MG tablet Take 1 tablet (500 mg total) by mouth 2 (two) times daily. One po bid x 7 days 10/13/16   Melene Plan, DO    Family History Family History  Problem Relation Age of Onset  . Diabetes Mother   . Hypertension Mother     Social History Social History   Tobacco Use  . Smoking status: Current Some Day Smoker  . Smokeless tobacco: Never Used  Substance Use Topics  . Alcohol use: Yes    Comment: occ  . Drug use: Yes    Types: Marijuana     Allergies   Patient has no known  allergies.   Review of Systems Review of Systems  All systems reviewed and negative, other than as noted in HPI.  Physical Exam Updated Vital Signs BP 129/89 (BP Location: Right Arm)   Pulse 97   Temp 98.7 F (37.1 C)   Resp 16   Ht 5\' 4"  (1.626 m)   Wt 108.9 kg (240 lb)   LMP 05/28/2017 (Exact Date)   SpO2 100%   BMI 41.20 kg/m   Physical Exam  Constitutional: She appears well-developed and well-nourished. No distress.  HENT:  Head: Normocephalic and atraumatic.  Eyes: Conjunctivae are normal. Right eye exhibits no discharge. Left eye exhibits no discharge.  Neck: Neck supple.  Cardiovascular: Normal rate, regular rhythm and normal heart sounds. Exam reveals no gallop and no friction rub.  No murmur heard. Pulmonary/Chest: Effort normal and breath sounds normal. No respiratory distress.  Abdominal: Soft. She exhibits no distension. There is no tenderness.  Musculoskeletal: She exhibits no edema or tenderness.  Neurological: She is alert.  Skin: Skin is warm and dry.  Psychiatric: She has a normal mood and affect. Her behavior is normal. Thought content normal.  Nursing note and vitals  reviewed.    ED Treatments / Results  Labs (all labs ordered are listed, but only abnormal results are displayed) Labs Reviewed - No data to display  EKG  EKG Interpretation None       Radiology No results found.   Dg Chest 2 View  Result Date: 06/24/2017 CLINICAL DATA:  Cough, cold, congestion EXAM: CHEST  2 VIEW COMPARISON:  None. FINDINGS: Lungs are clear.  No pleural effusion or pneumothorax. The heart is normal in size. Visualized osseous structures are within normal limits. IMPRESSION: Normal chest radiographs. Electronically Signed   By: Charline BillsSriyesh  Krishnan M.D.   On: 06/24/2017 10:07    Procedures Procedures (including critical care time)  Medications Ordered in ED Medications  dexamethasone (DECADRON) tablet 12 mg (12 mg Oral Given 06/24/17 1002)     Initial  Impression / Assessment and Plan / ED Course  I have reviewed the triage vital signs and the nursing notes.  Pertinent labs & imaging results that were available during my care of the patient were reviewed by me and considered in my medical decision making (see chart for details).     32 year old female with cough.  I suspect this is a viral URI given her description.  Duration of symptoms is little bit atypical though.  Chest x-ray is clear.  She sounds clear on exam.  She has no increased work of breathing.  I doubt PE.  Plan continue symptomatic treatment.  If symptoms persist she may potentially benefit from a trial of a PPI or H2 blocker for possible GERD.    Final Clinical Impressions(s) / ED Diagnoses   Final diagnoses:  Viral URI with cough    ED Discharge Orders        Ordered    benzonatate (TESSALON) 100 MG capsule  Every 8 hours     06/24/17 1048       Raeford RazorKohut, Pilar Corrales, MD 06/28/17 1724

## 2017-08-08 ENCOUNTER — Emergency Department (HOSPITAL_BASED_OUTPATIENT_CLINIC_OR_DEPARTMENT_OTHER)
Admission: EM | Admit: 2017-08-08 | Discharge: 2017-08-08 | Disposition: A | Discharge status: Home / Self Care | Payer: Self-pay | Attending: Emergency Medicine | Admitting: Emergency Medicine

## 2017-08-08 ENCOUNTER — Encounter (HOSPITAL_BASED_OUTPATIENT_CLINIC_OR_DEPARTMENT_OTHER): Payer: Self-pay | Admitting: *Deleted

## 2017-08-08 ENCOUNTER — Other Ambulatory Visit: Payer: Self-pay

## 2017-08-08 DIAGNOSIS — Z79899 Other long term (current) drug therapy: Secondary | ICD-10-CM | POA: Insufficient documentation

## 2017-08-08 DIAGNOSIS — L720 Epidermal cyst: Secondary | ICD-10-CM | POA: Insufficient documentation

## 2017-08-08 DIAGNOSIS — F17210 Nicotine dependence, cigarettes, uncomplicated: Secondary | ICD-10-CM | POA: Insufficient documentation

## 2017-08-08 MED ORDER — OXYCODONE-ACETAMINOPHEN 5-325 MG PO TABS
1.0000 | ORAL_TABLET | Freq: Once | ORAL | Status: AC
  Administered 2017-08-08: 1 via ORAL
  Filled 2017-08-08: qty 1

## 2017-08-08 MED ORDER — SULFAMETHOXAZOLE-TRIMETHOPRIM 800-160 MG PO TABS: 1.0000 | ORAL_TABLET | Freq: Two times a day (BID) | ORAL | 0 refills | Status: AC

## 2017-08-08 MED FILL — SULFAMETHOXAZOLE-TMP DS TAB: 800-160 | 7 days supply | Qty: 14 | Fill #0

## 2017-08-08 NOTE — ED Provider Notes (Signed)
[facility] EMERGENCY DEPARTMENT Provider Note   CSN: [id number] Arrival date & time: 08/08/17  1051     History   Chief Complaint Chief Complaint  Patient presents with  . finger problem    HPI Rose Hunt is a 32 y.o. femalehere for evaluation of left index finger swelling, redness, pain x 1 week. Worse with palpation and movement. Has not tried anything for symptoms. States she had a bump on this finger for at least 1 month but was not red or painful. In the last week redness, pain, swelling got worse. Denies trauma. Denies IVDU. Denies fevers chills.   HPI  History reviewed. No pertinent past medical history.  There are no active problems to display for this patient.   Past Surgical History:  Procedure Laterality Date  . TUBAL LIGATION  2007  . TUBAL LIGATION      OB History    Gravida Para Term Preterm AB Living   3 2 2   1 2    SAB TAB Ectopic Multiple Live Births     1             Home Medications    Prior to Admission medications   Medication Sig Start Date End Date Taking? Authorizing Provider  benzonatate (TESSALON) 100 MG capsule Take 1 capsule (100 mg total) by mouth every 8 (eight) hours. 06/24/17   Raeford RazorKohut, Stephen, MD  doxycycline (VIBRAMYCIN) 100 MG capsule Take 1 capsule (100 mg total) by mouth 2 (two) times daily. One po bid x 7 days 10/13/16   Melene PlanFloyd, Dan, DO  metroNIDAZOLE (FLAGYL) 500 MG tablet Take 1 tablet (500 mg total) by mouth 2 (two) times daily. One po bid x 7 days 10/13/16   Melene PlanFloyd, Dan, DO  sulfamethoxazole-trimethoprim (BACTRIM DS,SEPTRA DS) 800-160 MG tablet Take 1 tablet by mouth 2 (two) times daily for 7 days. 08/08/17 08/15/17  Liberty HandyGibbons, Anureet Bruington J, PA-C    Family History Family History  Problem Relation Age of Onset  . Diabetes Mother   . Hypertension Mother     Social History Social History   Tobacco Use  . Smoking status: Current Some Day Smoker  . Smokeless tobacco: Never Used  Substance Use Topics  . Alcohol  use: Yes    Comment: occ  . Drug use: Yes    Types: Marijuana     Allergies   Patient has no known allergies.   Review of Systems Review of Systems  Musculoskeletal: Positive for arthralgias.  Skin: Positive for color change.  All other systems reviewed and are negative.    Physical Exam Updated Vital Signs BP (!) 138/102   Pulse 63   Temp 98.4 F (36.9 C) (Oral)   Resp 18   Ht 5\' 5"  (1.651 m)   Wt 102.1 kg (225 lb)   LMP 08/05/2017   SpO2 100%   BMI 37.44 kg/m   Physical Exam  Constitutional: She is oriented to person, place, and time. She appears well-developed and well-nourished. No distress.  NAD.  HENT:  Head: Normocephalic and atraumatic.  Right Ear: External ear normal.  Left Ear: External ear normal.  Nose: Nose normal.  Eyes: Conjunctivae and EOM are normal. No scleral icterus.  Neck: Normal range of motion.  Cardiovascular: Normal rate.  Pulmonary/Chest: Effort normal.  Musculoskeletal: Normal range of motion. She exhibits no deformity.  Full PROM of left index finger without pain. No focal tenderness to dorsal or ventral aspect of left hand. No redness up hand or forearm.  Neurological: She is alert and oriented to person, place, and time.  Skin: Skin is warm and dry. Capillary refill takes less than 2 seconds. There is erythema.  2 x 2 cm freely mobile, soft, cyst like lesion with black central punctum to dorsal aspect of left index finger over proximal phalanx with overlaying, focal erythema and tenderness.   Psychiatric: She has a normal mood and affect. Her behavior is normal. Judgment and thought content normal.  Nursing note and vitals reviewed.    ED Treatments / Results  Labs (all labs ordered are listed, but only abnormal results are displayed) Labs Reviewed - No data to display  EKG  EKG Interpretation None       Radiology No results found.  Procedures Procedures (including critical care time)  Medications Ordered in  ED Medications  oxyCODONE-acetaminophen (PERCOCET/ROXICET) 5-325 MG per tablet 1 tablet (not administered)     Initial Impression / Assessment and Plan / ED Course  I have reviewed the triage vital signs and the nursing notes.  Pertinent labs & imaging results that were available during my care of the patient were reviewed by me and considered in my medical decision making (see chart for details).    Exam most consistent with epidermoid cyst likely inflamed or infected. No signs of tendosynovitis , septic arthritis or systemic illness. No fevers, chills. Will discharge with antibiotics, warm compresses and fu with hand for cyst excision. Discussed return precautions. Pt discussed with SP.   Final Clinical Impressions(s) / ED Diagnoses   Final diagnoses:  Epidermoid cyst of finger    ED Discharge Orders        Ordered    sulfamethoxazole-trimethoprim (BACTRIM DS,SEPTRA DS) 800-160 MG tablet  2 times daily     08/08/17 1225       Jerrell Mylar 08/08/17 1301    Rolland Porter, MD 08/09/17 [phone]

## 2017-08-08 NOTE — ED Triage Notes (Signed)
Pt reports pain to her left pointer finger x 2 weeks, swelling for a week. Denies injury or trauma.

## 2017-08-08 NOTE — Discharge Instructions (Signed)
You have an inflamed, possibly infected sebaceous cyst.   Take antibiotic as prescribed. Warm compresses to help clear out infection. Your cyst may spontaneously drain out pus, if this happens, continue doing warm compresses and continue antibiotics. Take 1000 mg tylenol plus 600 mg ibuprofen every 6 hours. Sebaceous cysts tend to get inflamed and infected especially if they are in areas of friction. Follow up with hand surgery who can excise or remove the cyst.   Return for swelling, redness, warmth up your hand or arm, fevers, chills.

## 2017-09-21 ENCOUNTER — Emergency Department (HOSPITAL_BASED_OUTPATIENT_CLINIC_OR_DEPARTMENT_OTHER)
Admission: EM | Admit: 2017-09-21 | Discharge: 2017-09-21 | Disposition: A | Discharge status: Home / Self Care | Payer: Self-pay | Attending: Emergency Medicine | Admitting: Emergency Medicine

## 2017-09-21 ENCOUNTER — Other Ambulatory Visit: Payer: Self-pay

## 2017-09-21 ENCOUNTER — Encounter (HOSPITAL_BASED_OUTPATIENT_CLINIC_OR_DEPARTMENT_OTHER): Payer: Self-pay | Admitting: Emergency Medicine

## 2017-09-21 DIAGNOSIS — R51 Headache: Secondary | ICD-10-CM | POA: Insufficient documentation

## 2017-09-21 DIAGNOSIS — Z5321 Procedure and treatment not carried out due to patient leaving prior to being seen by health care provider: Secondary | ICD-10-CM | POA: Insufficient documentation

## 2017-09-21 NOTE — ED Triage Notes (Signed)
Patient reports headache that began today after lunch.  Denies nausea, vomiting, photophobia.

## 2017-09-22 ENCOUNTER — Encounter (HOSPITAL_BASED_OUTPATIENT_CLINIC_OR_DEPARTMENT_OTHER): Payer: Self-pay

## 2017-09-22 ENCOUNTER — Emergency Department (HOSPITAL_BASED_OUTPATIENT_CLINIC_OR_DEPARTMENT_OTHER)
Admission: EM | Admit: 2017-09-22 | Discharge: 2017-09-22 | Disposition: A | Discharge status: Home / Self Care | Payer: Self-pay | Attending: Emergency Medicine | Admitting: Emergency Medicine

## 2017-09-22 ENCOUNTER — Other Ambulatory Visit: Payer: Self-pay

## 2017-09-22 DIAGNOSIS — G44209 Tension-type headache, unspecified, not intractable: Secondary | ICD-10-CM | POA: Insufficient documentation

## 2017-09-22 MED ORDER — ACETAMINOPHEN 325 MG PO TABS
650.0000 mg | ORAL_TABLET | Freq: Once | ORAL | Status: AC
Start: 2017-09-22 — End: 2017-09-22
  Administered 2017-09-22: 650 mg via ORAL
  Filled 2017-09-22: qty 2

## 2017-09-22 NOTE — Discharge Instructions (Addendum)
You may take Tylenol or ibuprofen as needed for headache.  Please seek immediate care if you have worsening of your headache or other symptoms concerning to you.

## 2017-09-22 NOTE — ED Triage Notes (Signed)
C/o HA day 2-states she LWBS here yesterday for same-NAD-steady gait

## 2017-09-22 NOTE — ED Provider Notes (Signed)
[facility] EMERGENCY DEPARTMENT Provider Note   CSN: [id number] Arrival date & time: 09/22/17  2043     History   Chief Complaint Chief Complaint  Patient presents with  . Headache    HPI Rose Hunt is a 32 y.o. female with no significant past medical history presents with headache.  Patient reports having headache for the last 1 day.  Headache is over a frontal area right above her nose and over her occipital area.  Denies history of migraine headache or recurrent headache.  Describes the headache as throbbing.  Pain currently 7 out of 10.  Denies recent illness or trauma.  She reports associated photophobia but denies nausea, vomiting or phonophobia.  She also reports feeling lightheaded when she tried to get up at work yesterday. He tried Tylenol with minimal improvement.  She denies runny nose, congestion, fever, chills, chest pain or dyspnea. Denies smoking cigarettes.  She admits occasional alcohol.  Denies recreational drug use. HPI  History reviewed. No pertinent past medical history.  There are no active problems to display for this patient.   Past Surgical History:  Procedure Laterality Date  . TUBAL LIGATION  2007  . TUBAL LIGATION       OB History    Gravida  3   Para  2   Term  2   Preterm      AB  1   Living  2     SAB      TAB  1   Ectopic      Multiple      Live Births               Home Medications    Prior to Admission medications   Medication Sig Start Date End Date Taking? Authorizing Provider  benzonatate (TESSALON) 100 MG capsule Take 1 capsule (100 mg total) by mouth every 8 (eight) hours. 06/24/17   Raeford Razor, MD  doxycycline (VIBRAMYCIN) 100 MG capsule Take 1 capsule (100 mg total) by mouth 2 (two) times daily. One po bid x 7 days 10/13/16   Melene Plan, DO  metroNIDAZOLE (FLAGYL) 500 MG tablet Take 1 tablet (500 mg total) by mouth 2 (two) times daily. One po bid x 7 days 10/13/16   Melene Plan, DO     Family History Family History  Problem Relation Age of Onset  . Diabetes Mother   . Hypertension Mother     Social History Social History   Tobacco Use  . Smoking status: Former Games developer  . Smokeless tobacco: Never Used  Substance Use Topics  . Alcohol use: Yes    Comment: occ  . Drug use: Yes    Types: Marijuana     Allergies   Patient has no known allergies.   Review of Systems Review of Systems  Constitutional: Negative for chills and fever.  HENT: Negative for congestion, ear pain, rhinorrhea and sore throat.   Eyes: Positive for photophobia. Negative for pain and visual disturbance.  Respiratory: Negative for cough and shortness of breath.   Cardiovascular: Negative for chest pain and palpitations.  Gastrointestinal: Negative for abdominal pain, nausea and vomiting.  Genitourinary: Negative for dysuria and hematuria.  Musculoskeletal: Negative for back pain, neck pain and neck stiffness.  Skin: Negative for color change and rash.  Neurological: Positive for headaches. Negative for weakness and numbness.  Psychiatric/Behavioral: Negative for confusion.  All other systems reviewed and are negative.  Physical Exam Updated Vital Signs BP 125/78 (BP Location:  Left Arm)   Pulse 70   Temp 98.3 F (36.8 C) (Oral)   Resp 16   Ht 5\' 5"  (1.651 m)   Wt 106.4 kg (234 lb 9.1 oz)   LMP 09/07/2017 (Approximate)   SpO2 99%   BMI 39.03 kg/m   Physical Exam GEN: Sleeping.  Arises easily. No apparent distress. Head: normocephalic and atraumatic.  No tenderness over temporal areas. Mobile cyst behind right ear (chronic).  No overlying or surrounding skin erythema Eyes: conjunctiva without injection. Sclera anicteric. PERRLA. EOMI. Photophobia but patient was sleeping.  Oropharynx: MMM. No erythema. No exudation or petechiae.  Uvula midline HEM: negative for cervical or periauricular lymphadenopathies CVS: RRR, nl s1 & s2, no murmurs, no edema RESP: no IWOB, good air  movement bilaterally, CTAB GI: BS present & normal, soft, NTND GU: no suprapubic or CVA tenderness MSK: no focal tenderness or notable swelling SKIN: no apparent skin lesion NEURO:Awake, alert and oriented appropriately. Cranial nerves II-XII intact, motor 5/5 in all muscle groups of UE and LE bilaterally, normal tone, light sensation intact in all dermatomes of upper and lower ext bilaterally, finger to nose intact PSYCH: euthymic mood with congruent affect   ED Treatments / Results  Labs (all labs ordered are listed, but only abnormal results are displayed) Labs Reviewed - No data to display  EKG None  Radiology No results found.  Procedures Procedures (including critical care time)  Medications Ordered in ED Medications  acetaminophen (TYLENOL) tablet 650 mg (has no administration in time range)     Initial Impression / Assessment and Plan / ED Course  I have reviewed the triage vital signs and the nursing notes.  Pertinent labs & imaging results that were available during my care of the patient were reviewed by me and considered in my medical decision making (see chart for details).  32 year old female with no significant past medical history who presented with headache likely tension headache.  Headache not consistent with migraine.  He has no temporal tenderness.  Neuro exam within normal limits.  She has no red flags for infectious etiology or intracranial process.  Gave Tylenol 650 mg once.  Discussed conservative management including Tylenol or ibuprofen as needed for pain.  Return precautions discussed.  Final Clinical Impressions(s) / ED Diagnoses   Final diagnoses:  Tension headache    ED Discharge Orders    None       Almon HerculesGonfa, Taye T, MD 09/22/17 2306    Maia PlanLong, Joshua G, MD 09/23/17 1248

## 2018-06-20 ENCOUNTER — Emergency Department (HOSPITAL_BASED_OUTPATIENT_CLINIC_OR_DEPARTMENT_OTHER): Payer: Self-pay

## 2018-06-20 ENCOUNTER — Emergency Department (HOSPITAL_BASED_OUTPATIENT_CLINIC_OR_DEPARTMENT_OTHER)
Admission: EM | Admit: 2018-06-20 | Discharge: 2018-06-20 | Disposition: A | Discharge status: Home / Self Care | Payer: Self-pay | Attending: Emergency Medicine | Admitting: Emergency Medicine

## 2018-06-20 ENCOUNTER — Encounter (HOSPITAL_BASED_OUTPATIENT_CLINIC_OR_DEPARTMENT_OTHER): Payer: Self-pay | Admitting: Emergency Medicine

## 2018-06-20 ENCOUNTER — Other Ambulatory Visit: Payer: Self-pay

## 2018-06-20 DIAGNOSIS — Z87891 Personal history of nicotine dependence: Secondary | ICD-10-CM | POA: Insufficient documentation

## 2018-06-20 DIAGNOSIS — A5901 Trichomonal vulvovaginitis: Secondary | ICD-10-CM | POA: Insufficient documentation

## 2018-06-20 DIAGNOSIS — N760 Acute vaginitis: Secondary | ICD-10-CM | POA: Insufficient documentation

## 2018-06-20 DIAGNOSIS — A599 Trichomoniasis, unspecified: Secondary | ICD-10-CM

## 2018-06-20 DIAGNOSIS — R059 Cough, unspecified: Secondary | ICD-10-CM

## 2018-06-20 DIAGNOSIS — B9689 Other specified bacterial agents as the cause of diseases classified elsewhere: Secondary | ICD-10-CM

## 2018-06-20 DIAGNOSIS — R05 Cough: Secondary | ICD-10-CM | POA: Insufficient documentation

## 2018-06-20 DIAGNOSIS — N939 Abnormal uterine and vaginal bleeding, unspecified: Secondary | ICD-10-CM

## 2018-06-20 LAB — URINALYSIS, ROUTINE W REFLEX MICROSCOPIC
Bilirubin Urine: NEGATIVE
Glucose, UA: NEGATIVE mg/dL
Ketones, ur: 15 mg/dL — AB
Nitrite: NEGATIVE
Protein, ur: NEGATIVE mg/dL
Specific Gravity, Urine: 1.025 (ref 1.005–1.030)
pH: 6 (ref 5.0–8.0)

## 2018-06-20 LAB — URINALYSIS, MICROSCOPIC (REFLEX)

## 2018-06-20 LAB — PREGNANCY, URINE: Preg Test, Ur: NEGATIVE

## 2018-06-20 LAB — WET PREP, GENITAL
Sperm: NONE SEEN
Yeast Wet Prep HPF POC: NONE SEEN

## 2018-06-20 LAB — GROUP A STREP BY PCR: Group A Strep by PCR: NOT DETECTED

## 2018-06-20 MED ORDER — AZITHROMYCIN 250 MG PO TABS
1000.0000 mg | ORAL_TABLET | Freq: Once | ORAL | Status: AC
  Administered 2018-06-20: 1000 mg via ORAL
  Filled 2018-06-20: qty 4

## 2018-06-20 MED ORDER — METRONIDAZOLE 500 MG PO TABS
1500.0000 mg | ORAL_TABLET | Freq: Once | ORAL | Status: AC
  Administered 2018-06-20: 1500 mg via ORAL
  Filled 2018-06-20: qty 3

## 2018-06-20 MED ORDER — LIDOCAINE HCL (PF) 1 % IJ SOLN
INTRAMUSCULAR | Status: AC
  Administered 2018-06-20: 5 mL
  Filled 2018-06-20: qty 5

## 2018-06-20 MED ORDER — BENZONATATE 100 MG PO CAPS: 100.0000 mg | ORAL_CAPSULE | Freq: Three times a day (TID) | ORAL | 0 refills | Status: DC

## 2018-06-20 MED ORDER — METRONIDAZOLE 500 MG PO TABS
500.0000 mg | ORAL_TABLET | Freq: Once | ORAL | Status: AC
  Administered 2018-06-20: 500 mg via ORAL
  Filled 2018-06-20: qty 1

## 2018-06-20 MED ORDER — CEFTRIAXONE SODIUM 250 MG IJ SOLR
250.0000 mg | Freq: Once | INTRAMUSCULAR | Status: AC
  Administered 2018-06-20: 250 mg via INTRAMUSCULAR
  Filled 2018-06-20: qty 250

## 2018-06-20 NOTE — ED Triage Notes (Signed)
Patient states that she is having a cough for about a week  - she reports that it is was getting better but this am she started to have tightness on her chest and coughing became worse  - she is also spotting

## 2018-06-20 NOTE — Discharge Instructions (Signed)
Take Tessalon every 8 hours as needed for cough.  You will be called in 3 days if you test positive for gonorrhea or chlamydia, although you have already been treated.  If positive, make sure your sexual partners are treated as well.  We do know that you are positive for trichomonas and I recommend your sexual partners should be treated for that as well and abstain from sexual intercourse until you both have been treated for 1 week.  Please follow-up with the OB/GYN clinic for further evaluation and treatment of your vaginal bleeding.  Please return the emergency department if you develop any new or worsening symptoms.

## 2018-06-20 NOTE — ED Notes (Signed)
NAD at this time. Pt is stable and going home.  

## 2018-06-20 NOTE — ED Provider Notes (Signed)
[facility] EMERGENCY DEPARTMENT Provider Note   CSN: [id number] Arrival date & time: 06/20/18  1416     History   Chief Complaint Chief Complaint  Patient presents with  . Cough  . Vaginal Bleeding    HPI Rose Hunt is a 32 y.o. female who presents with 1 week history of cough and sore throat.  She also reports abnormal vaginal bleeding that began last week.  She reports she had couple days of bleeding in the middle of her cycle with clots.  She reports now she is just spotting.  She had a tubal ligation 12 years ago.  She has normal menstrual cycles.  She denies any other abnormal vaginal discharge.  She denies any urinary symptoms.  She reports some very mild lower pelvic cramping, but not as much as her normal menstrual cycle cramping.  She denies any fevers.  She does not currently have a primary care provider or PCP.  HPI  History reviewed. No pertinent past medical history.  There are no active problems to display for this patient.   Past Surgical History:  Procedure Laterality Date  . TUBAL LIGATION  2007  . TUBAL LIGATION       OB History    Gravida  3   Para  2   Term  2   Preterm      AB  1   Living  2     SAB      TAB  1   Ectopic      Multiple      Live Births               Home Medications    Prior to Admission medications   Medication Sig Start Date End Date Taking? Authorizing Provider  benzonatate (TESSALON) 100 MG capsule Take 1 capsule (100 mg total) by mouth every 8 (eight) hours. 06/20/18   Mishaal Lansdale, Waylan Boga, PA-C  doxycycline (VIBRAMYCIN) 100 MG capsule Take 1 capsule (100 mg total) by mouth 2 (two) times daily. One po bid x 7 days 10/13/16   Melene Plan, DO  metroNIDAZOLE (FLAGYL) 500 MG tablet Take 1 tablet (500 mg total) by mouth 2 (two) times daily. One po bid x 7 days 10/13/16   Melene Plan, DO    Family History Family History  Problem Relation Age of Onset  . Diabetes Mother   . Hypertension Mother      Social History Social History   Tobacco Use  . Smoking status: Former Games developer  . Smokeless tobacco: Never Used  Substance Use Topics  . Alcohol use: Yes    Comment: occ  . Drug use: Yes    Types: Marijuana     Allergies   Patient has no known allergies.   Review of Systems Review of Systems  Constitutional: Negative for chills and fever.  HENT: Positive for congestion and sore throat. Negative for facial swelling.   Respiratory: Negative for shortness of breath.   Cardiovascular: Positive for chest pain (with coughing only).  Gastrointestinal: Negative for abdominal pain, nausea and vomiting.  Genitourinary: Positive for vaginal bleeding. Negative for dysuria and vaginal discharge.  Musculoskeletal: Negative for back pain.  Skin: Negative for rash and wound.  Neurological: Negative for dizziness, light-headedness and headaches.  Psychiatric/Behavioral: The patient is not nervous/anxious.      Physical Exam Updated Vital Signs BP 113/85 (BP Location: Right Arm)   Pulse 65   Temp 98.4 F (36.9 C) (Oral)   Resp 14  Ht 5\' 5"  (1.651 m)   Wt 104.3 kg   LMP 06/20/2018   SpO2 100%   BMI 38.27 kg/m   Physical Exam Vitals signs and nursing note reviewed. Exam conducted with a chaperone present.  Constitutional:      General: She is not in acute distress.    Appearance: She is well-developed. She is not diaphoretic.  HENT:     Head: Normocephalic and atraumatic.     Right Ear: Tympanic membrane normal.     Left Ear: Tympanic membrane normal.     Mouth/Throat:     Pharynx: No oropharyngeal exudate or posterior oropharyngeal erythema.  Eyes:     General: No scleral icterus.       Right eye: No discharge.        Left eye: No discharge.     Conjunctiva/sclera: Conjunctivae normal.     Pupils: Pupils are equal, round, and reactive to light.  Neck:     Musculoskeletal: Normal range of motion and neck supple.     Thyroid: No thyromegaly.  Cardiovascular:      Rate and Rhythm: Normal rate and regular rhythm.     Heart sounds: Normal heart sounds. No murmur. No friction rub. No gallop.   Pulmonary:     Effort: Pulmonary effort is normal. No respiratory distress.     Breath sounds: Normal breath sounds. No stridor. No wheezing or rales.  Abdominal:     General: Bowel sounds are normal. There is no distension.     Palpations: Abdomen is soft.     Tenderness: There is no abdominal tenderness. There is no guarding or rebound.  Genitourinary:    Vagina: Bleeding present.     Cervix: Cervical bleeding present. No cervical motion tenderness.     Uterus: Normal.      Adnexa:        Right: No tenderness.         Left: No tenderness.    Lymphadenopathy:     Cervical: No cervical adenopathy.  Skin:    General: Skin is warm and dry.     Coloration: Skin is not pale.     Findings: No rash.  Neurological:     Mental Status: She is alert.     Coordination: Coordination normal.      ED Treatments / Results  Labs (all labs ordered are listed, but only abnormal results are displayed) Labs Reviewed  WET PREP, GENITAL - Abnormal; Notable for the following components:      Result Value   Trich, Wet Prep PRESENT (*)    Clue Cells Wet Prep HPF POC PRESENT (*)    WBC, Wet Prep HPF POC MODERATE (*)    All other components within normal limits  URINALYSIS, ROUTINE W REFLEX MICROSCOPIC - Abnormal; Notable for the following components:   APPearance CLOUDY (*)    Hgb urine dipstick LARGE (*)    Ketones, ur 15 (*)    Leukocytes, UA TRACE (*)    All other components within normal limits  URINALYSIS, MICROSCOPIC (REFLEX) - Abnormal; Notable for the following components:   Bacteria, UA RARE (*)    All other components within normal limits  GROUP A STREP BY PCR  URINE CULTURE  PREGNANCY, URINE  GC/CHLAMYDIA PROBE AMP ([place]) NOT AT [facility]    EKG EKG Interpretation  Date/Time:  Tuesday June 20 2018 14:28:00 EST Ventricular Rate:  85 PR  Interval:  146 QRS Duration: 76 QT Interval:  352 QTC Calculation: 418  R Axis:   63 Text Interpretation:  Normal sinus rhythm with sinus arrhythmia Normal ECG No old tracing to compare Confirmed by BartlesvilleJacubowitz, Doreatha MartinSam [phone]([id number]) on 06/20/2018 2:51:26 PM   Radiology Dg Chest 2 View  Result Date: 06/20/2018 CLINICAL DATA:  Chest pain with cough EXAM: CHEST - 2 VIEW COMPARISON:  06/24/2017 FINDINGS: The heart size and mediastinal contours are within normal limits. Both lungs are clear. The visualized skeletal structures are unremarkable. IMPRESSION: No active cardiopulmonary disease. Electronically Signed   By: Tollie Ethavid  Kwon M.D.   On: 06/20/2018 14:50    Procedures Procedures (including critical care time)  Medications Ordered in ED Medications  cefTRIAXone (ROCEPHIN) injection 250 mg (250 mg Intramuscular Given 06/20/18 1722)  azithromycin (ZITHROMAX) tablet 1,000 mg (1,000 mg Oral Given 06/20/18 1723)  metroNIDAZOLE (FLAGYL) tablet 500 mg (500 mg Oral Given 06/20/18 1723)  lidocaine (PF) (XYLOCAINE) 1 % injection (5 mLs  Given 06/20/18 1722)  metroNIDAZOLE (FLAGYL) tablet 1,500 mg (1,500 mg Oral Given 06/20/18 1741)     Initial Impression / Assessment and Plan / ED Course  I have reviewed the triage vital signs and the nursing notes.  Pertinent labs & imaging results that were available during my care of the patient were reviewed by me and considered in my medical decision making (see chart for details).     Patient presenting with a one-week history of cough and sore throat.  Chest x-ray is negative.  Rapid strep negative.  Suspect viral URI and will treat supportively.  Regarding patient's vaginal bleeding, patient is positive for trichomonas and clue cells.  UA shows large hematuria, trace leukocytes, rare bacteria.  Urine pregnancy negative.  Urine culture sent.  Patient requested a single dose treatment of Flagyl, which was given, but she is advised that this may not completely treat  for BV.  She understands and requests it anyway.  Patient advised to follow-up with OB/GYN for abnormal vaginal bleeding.  Return precautions discussed.  Patient understands and agrees with plan.  Patient vitals stable throughout ED course and discharged in satisfactory condition.  Final Clinical Impressions(s) / ED Diagnoses   Final diagnoses:  Cough  Vaginal bleeding  Trichomoniasis  BV (bacterial vaginosis)    ED Discharge Orders         Ordered    benzonatate (TESSALON) 100 MG capsule  Every 8 hours     06/20/18 1737           Emi HolesLaw, Arnaldo Heffron M, PA-C 06/21/18 1017    Gwyneth SproutPlunkett, Whitney, MD 06/22/18 2208

## 2018-06-22 LAB — URINE CULTURE: Culture: NO GROWTH

## 2018-06-22 LAB — GC/CHLAMYDIA PROBE AMP (~~LOC~~) NOT AT ARMC
Chlamydia: NEGATIVE
Neisseria Gonorrhea: NEGATIVE

## 2018-08-29 ENCOUNTER — Emergency Department (HOSPITAL_BASED_OUTPATIENT_CLINIC_OR_DEPARTMENT_OTHER)
Admission: EM | Admit: 2018-08-29 | Discharge: 2018-08-29 | Disposition: A | Discharge status: Home / Self Care | Payer: No Typology Code available for payment source | Attending: Emergency Medicine | Admitting: Emergency Medicine

## 2018-08-29 ENCOUNTER — Encounter (HOSPITAL_BASED_OUTPATIENT_CLINIC_OR_DEPARTMENT_OTHER): Payer: Self-pay | Admitting: Emergency Medicine

## 2018-08-29 ENCOUNTER — Other Ambulatory Visit: Payer: Self-pay

## 2018-08-29 DIAGNOSIS — Y92410 Unspecified street and highway as the place of occurrence of the external cause: Secondary | ICD-10-CM | POA: Diagnosis not present

## 2018-08-29 DIAGNOSIS — F1210 Cannabis abuse, uncomplicated: Secondary | ICD-10-CM | POA: Insufficient documentation

## 2018-08-29 DIAGNOSIS — M545 Low back pain: Secondary | ICD-10-CM | POA: Insufficient documentation

## 2018-08-29 DIAGNOSIS — Y998 Other external cause status: Secondary | ICD-10-CM | POA: Diagnosis not present

## 2018-08-29 DIAGNOSIS — Z87891 Personal history of nicotine dependence: Secondary | ICD-10-CM | POA: Insufficient documentation

## 2018-08-29 DIAGNOSIS — Y9389 Activity, other specified: Secondary | ICD-10-CM | POA: Diagnosis not present

## 2018-08-29 LAB — PREGNANCY, URINE: PREG TEST UR: NEGATIVE

## 2018-08-29 MED ORDER — MELOXICAM 7.5 MG PO TABS: 7.5000 mg | ORAL_TABLET | Freq: Every day | ORAL | 0 refills | Status: DC

## 2018-08-29 MED ORDER — METHOCARBAMOL 500 MG PO TABS: 500.0000 mg | ORAL_TABLET | Freq: Three times a day (TID) | ORAL | 0 refills | Status: DC | PRN

## 2018-08-29 MED ORDER — MELOXICAM 7.5 MG PO TABS
7.5000 mg | ORAL_TABLET | Freq: Once | ORAL | Status: AC
  Administered 2018-08-29: 7.5 mg via ORAL
  Filled 2018-08-29: qty 1

## 2018-08-29 MED FILL — METHOCARBAMOL 500 MG TABLET: 500 | 5 days supply | Qty: 15 | Fill #0

## 2018-08-29 MED FILL — MELOXICAM 7.5 MG TABLET: 7.5 | 6 days supply | Qty: 6 | Fill #0
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
## 2019-01-09 ENCOUNTER — Emergency Department (HOSPITAL_BASED_OUTPATIENT_CLINIC_OR_DEPARTMENT_OTHER): Payer: Self-pay

## 2019-01-09 ENCOUNTER — Inpatient Hospital Stay (HOSPITAL_COMMUNITY): Payer: Self-pay | Admitting: Anesthesiology

## 2019-01-09 ENCOUNTER — Other Ambulatory Visit: Payer: Self-pay

## 2019-01-09 ENCOUNTER — Ambulatory Visit (HOSPITAL_BASED_OUTPATIENT_CLINIC_OR_DEPARTMENT_OTHER)
Admission: AD | Admit: 2019-01-09 | Discharge: 2019-01-10 | Disposition: A | Discharge status: Home / Self Care | Payer: Self-pay | Attending: Obstetrics & Gynecology | Admitting: Obstetrics & Gynecology

## 2019-01-09 ENCOUNTER — Encounter: Payer: Self-pay | Admitting: Obstetrics & Gynecology

## 2019-01-09 ENCOUNTER — Encounter (HOSPITAL_COMMUNITY)
Admission: AD | Disposition: A | Discharge status: Home / Self Care | Payer: Self-pay | Source: Home / Self Care | Attending: Emergency Medicine

## 2019-01-09 ENCOUNTER — Encounter (HOSPITAL_BASED_OUTPATIENT_CLINIC_OR_DEPARTMENT_OTHER): Payer: Self-pay | Admitting: *Deleted

## 2019-01-09 DIAGNOSIS — B9689 Other specified bacterial agents as the cause of diseases classified elsewhere: Secondary | ICD-10-CM | POA: Diagnosis present

## 2019-01-09 DIAGNOSIS — O00101 Right tubal pregnancy without intrauterine pregnancy: Secondary | ICD-10-CM | POA: Insufficient documentation

## 2019-01-09 DIAGNOSIS — N760 Acute vaginitis: Secondary | ICD-10-CM | POA: Diagnosis present

## 2019-01-09 DIAGNOSIS — A568 Sexually transmitted chlamydial infection of other sites: Secondary | ICD-10-CM | POA: Insufficient documentation

## 2019-01-09 DIAGNOSIS — K661 Hemoperitoneum: Secondary | ICD-10-CM | POA: Insufficient documentation

## 2019-01-09 DIAGNOSIS — N731 Chronic parametritis and pelvic cellulitis: Secondary | ICD-10-CM | POA: Diagnosis present

## 2019-01-09 DIAGNOSIS — O009 Unspecified ectopic pregnancy without intrauterine pregnancy: Secondary | ICD-10-CM

## 2019-01-09 DIAGNOSIS — Z9851 Tubal ligation status: Secondary | ICD-10-CM | POA: Insufficient documentation

## 2019-01-09 DIAGNOSIS — Z87891 Personal history of nicotine dependence: Secondary | ICD-10-CM | POA: Insufficient documentation

## 2019-01-09 DIAGNOSIS — A5901 Trichomonal vulvovaginitis: Secondary | ICD-10-CM | POA: Diagnosis present

## 2019-01-09 DIAGNOSIS — A599 Trichomoniasis, unspecified: Secondary | ICD-10-CM

## 2019-01-09 DIAGNOSIS — Z1159 Encounter for screening for other viral diseases: Secondary | ICD-10-CM | POA: Insufficient documentation

## 2019-01-09 HISTORY — PX: LAPAROSCOPIC UNILATERAL SALPINGECTOMY: SHX5934

## 2019-01-09 HISTORY — PX: LAPAROSCOPIC SALPINGO OOPHERECTOMY: SHX5927

## 2019-01-09 LAB — CBC WITH DIFFERENTIAL/PLATELET
Abs Immature Granulocytes: 0.02 10*3/uL (ref 0.00–0.07)
Basophils Absolute: 0 10*3/uL (ref 0.0–0.1)
Basophils Relative: 1 %
Eosinophils Absolute: 0.1 10*3/uL (ref 0.0–0.5)
Eosinophils Relative: 2 %
HCT: 30.7 % — ABNORMAL LOW (ref 36.0–46.0)
Hemoglobin: 10.5 g/dL — ABNORMAL LOW (ref 12.0–15.0)
Immature Granulocytes: 0 %
Lymphocytes Relative: 37 %
Lymphs Abs: 2.8 10*3/uL (ref 0.7–4.0)
MCH: 32.9 pg (ref 26.0–34.0)
MCHC: 34.2 g/dL (ref 30.0–36.0)
MCV: 96.2 fL (ref 80.0–100.0)
Monocytes Absolute: 0.7 10*3/uL (ref 0.1–1.0)
Monocytes Relative: 9 %
Neutro Abs: 3.9 10*3/uL (ref 1.7–7.7)
Neutrophils Relative %: 51 %
Platelets: 269 10*3/uL (ref 150–400)
RBC: 3.19 MIL/uL — ABNORMAL LOW (ref 3.87–5.11)
RDW: 13.2 % (ref 11.5–15.5)
WBC: 7.5 10*3/uL (ref 4.0–10.5)
nRBC: 0 % (ref 0.0–0.2)

## 2019-01-09 LAB — BASIC METABOLIC PANEL
Anion gap: 10 (ref 5–15)
BUN: 11 mg/dL (ref 6–20)
CO2: 21 mmol/L — ABNORMAL LOW (ref 22–32)
Calcium: 9 mg/dL (ref 8.9–10.3)
Chloride: 107 mmol/L (ref 98–111)
Creatinine, Ser: 0.85 mg/dL (ref 0.44–1.00)
GFR calc Af Amer: >60 mL/min (ref >60)
GFR calc non Af Amer: >60 mL/min (ref >60)
Glucose, Bld: 108 mg/dL — ABNORMAL HIGH (ref 70–99)
Potassium: 3.8 mmol/L (ref 3.5–5.1)
Sodium: 138 mmol/L (ref 135–145)

## 2019-01-09 LAB — URINALYSIS, MICROSCOPIC (REFLEX)

## 2019-01-09 LAB — URINALYSIS, ROUTINE W REFLEX MICROSCOPIC
Bilirubin Urine: NEGATIVE
Glucose, UA: NEGATIVE mg/dL
Ketones, ur: 15 mg/dL — AB
Leukocytes,Ua: NEGATIVE
Nitrite: NEGATIVE
Protein, ur: NEGATIVE mg/dL
Specific Gravity, Urine: >1.03 — ABNORMAL HIGH (ref 1.005–1.030)
pH: 6 (ref 5.0–8.0)

## 2019-01-09 LAB — WET PREP, GENITAL
Sperm: NONE SEEN
Yeast Wet Prep HPF POC: NONE SEEN

## 2019-01-09 LAB — CBC
HCT: 28.2 % — ABNORMAL LOW (ref 36.0–46.0)
Hemoglobin: 9.9 g/dL — ABNORMAL LOW (ref 12.0–15.0)
MCH: 33.4 pg (ref 26.0–34.0)
MCHC: 35.1 g/dL (ref 30.0–36.0)
MCV: 95.3 fL (ref 80.0–100.0)
Platelets: 253 10*3/uL (ref 150–400)
RBC: 2.96 MIL/uL — ABNORMAL LOW (ref 3.87–5.11)
RDW: 13.2 % (ref 11.5–15.5)
WBC: 7.3 10*3/uL (ref 4.0–10.5)
nRBC: 0 % (ref 0.0–0.2)

## 2019-01-09 LAB — HEPATIC FUNCTION PANEL
ALT: 17 U/L (ref 0–44)
AST: 23 U/L (ref 15–41)
Albumin: 3.9 g/dL (ref 3.5–5.0)
Alkaline Phosphatase: 56 U/L (ref 38–126)
Bilirubin, Direct: <0.1 mg/dL (ref 0.0–0.2)
Total Bilirubin: 0.4 mg/dL (ref 0.3–1.2)
Total Protein: 7.2 g/dL (ref 6.5–8.1)

## 2019-01-09 LAB — HCG, QUANTITATIVE, PREGNANCY: hCG, Beta Chain, Quant, S: 2824 m[IU]/mL — ABNORMAL HIGH (ref <5)

## 2019-01-09 LAB — SARS CORONAVIRUS 2 BY RT PCR (HOSPITAL ORDER, PERFORMED IN ~~LOC~~ HOSPITAL LAB): SARS Coronavirus 2: NEGATIVE

## 2019-01-09 LAB — PREPARE RBC (CROSSMATCH)

## 2019-01-09 SURGERY — SALPINGO-OOPHORECTOMY, LAPAROSCOPIC
Anesthesia: General | Laterality: Right

## 2019-01-09 MED ORDER — PHENYLEPHRINE 40 MCG/ML (10ML) SYRINGE FOR IV PUSH (FOR BLOOD PRESSURE SUPPORT)
PREFILLED_SYRINGE | INTRAVENOUS | Status: AC
  Filled 2019-01-09: qty 20

## 2019-01-09 MED ORDER — METRONIDAZOLE 500 MG PO TABS: 500.0000 mg | ORAL_TABLET | Freq: Two times a day (BID) | ORAL | 0 refills | Status: DC

## 2019-01-09 MED ORDER — CEFAZOLIN SODIUM-DEXTROSE 2-4 GM/100ML-% IV SOLN
2.0000 g | INTRAVENOUS | Status: AC
  Administered 2019-01-09: 2 g via INTRAVENOUS
  Filled 2019-01-09: qty 100

## 2019-01-09 MED ORDER — DEXAMETHASONE SODIUM PHOSPHATE 10 MG/ML IJ SOLN
INTRAMUSCULAR | Status: AC
  Filled 2019-01-09: qty 3

## 2019-01-09 MED ORDER — METRONIDAZOLE 500 MG PO TABS
500.0000 mg | ORAL_TABLET | Freq: Once | ORAL | Status: AC
  Administered 2019-01-09: 500 mg via ORAL
  Filled 2019-01-09: qty 1

## 2019-01-09 MED ORDER — FENTANYL CITRATE (PF) 250 MCG/5ML IJ SOLN
INTRAMUSCULAR | Status: AC
  Filled 2019-01-09: qty 5

## 2019-01-09 MED ORDER — EPHEDRINE 5 MG/ML INJ
INTRAVENOUS | Status: AC
  Filled 2019-01-09: qty 60

## 2019-01-09 MED ORDER — SODIUM CHLORIDE (PF) 0.9 % IJ SOLN
INTRAMUSCULAR | Status: AC
  Filled 2019-01-09: qty 10

## 2019-01-09 MED ORDER — ONDANSETRON HCL 4 MG/2ML IJ SOLN
INTRAMUSCULAR | Status: DC | PRN
  Administered 2019-01-09: 4 mg via INTRAVENOUS

## 2019-01-09 MED ORDER — ONDANSETRON HCL 4 MG/2ML IJ SOLN
INTRAMUSCULAR | Status: AC
  Filled 2019-01-09: qty 4

## 2019-01-09 MED ORDER — DEXAMETHASONE SODIUM PHOSPHATE 10 MG/ML IJ SOLN
INTRAMUSCULAR | Status: DC | PRN
  Administered 2019-01-09: 10 mg via INTRAVENOUS

## 2019-01-09 MED ORDER — FENTANYL CITRATE (PF) 250 MCG/5ML IJ SOLN
INTRAMUSCULAR | Status: DC | PRN
  Administered 2019-01-09: 50 ug via INTRAVENOUS
  Administered 2019-01-09: 100 ug via INTRAVENOUS
  Administered 2019-01-09: 50 ug via INTRAVENOUS
  Administered 2019-01-09: 100 ug via INTRAVENOUS
  Administered 2019-01-09 (×2): 50 ug via INTRAVENOUS

## 2019-01-09 MED ORDER — ACETAMINOPHEN 325 MG PO TABS
650.0000 mg | ORAL_TABLET | Freq: Once | ORAL | Status: AC
  Administered 2019-01-09: 650 mg via ORAL
  Filled 2019-01-09: qty 2

## 2019-01-09 MED ORDER — SODIUM CHLORIDE 0.9 % IV BOLUS
1000.0000 mL | Freq: Once | INTRAVENOUS | Status: AC
  Administered 2019-01-09: 1000 mL via INTRAVENOUS

## 2019-01-09 MED ORDER — ROCURONIUM BROMIDE 10 MG/ML (PF) SYRINGE
PREFILLED_SYRINGE | INTRAVENOUS | Status: AC
  Filled 2019-01-09: qty 20

## 2019-01-09 MED ORDER — BUPIVACAINE HCL (PF) 0.5 % IJ SOLN
INTRAMUSCULAR | Status: AC
  Filled 2019-01-09: qty 30

## 2019-01-09 MED ORDER — PHENYLEPHRINE HCL (PRESSORS) 10 MG/ML IV SOLN
INTRAVENOUS | Status: DC | PRN
  Administered 2019-01-09: 40 ug via INTRAVENOUS
  Administered 2019-01-09 (×2): 80 ug via INTRAVENOUS

## 2019-01-09 MED ORDER — SODIUM CHLORIDE 0.9 % IR SOLN
Status: DC | PRN
  Administered 2019-01-09: 1000 mL

## 2019-01-09 MED ORDER — BUPIVACAINE HCL (PF) 0.5 % IJ SOLN
INTRAMUSCULAR | Status: DC | PRN
  Administered 2019-01-09: 9 mL

## 2019-01-09 MED ORDER — METRONIDAZOLE 500 MG PO TABS: 500.0000 mg | ORAL_TABLET | Freq: Two times a day (BID) | ORAL | Status: DC

## 2019-01-09 MED ORDER — SUCCINYLCHOLINE CHLORIDE 20 MG/ML IJ SOLN
INTRAMUSCULAR | Status: DC | PRN
  Administered 2019-01-09: 120 mg via INTRAVENOUS

## 2019-01-09 MED ORDER — MIDAZOLAM HCL 2 MG/2ML IJ SOLN
INTRAMUSCULAR | Status: AC
  Filled 2019-01-09: qty 2

## 2019-01-09 MED ORDER — SUGAMMADEX SODIUM 200 MG/2ML IV SOLN
INTRAVENOUS | Status: DC | PRN
  Administered 2019-01-09: 200 mg via INTRAVENOUS

## 2019-01-09 MED ORDER — PROPOFOL 10 MG/ML IV BOLUS
INTRAVENOUS | Status: DC | PRN
  Administered 2019-01-09: 200 mg via INTRAVENOUS

## 2019-01-09 MED ORDER — ARTIFICIAL TEARS OPHTHALMIC OINT
TOPICAL_OINTMENT | OPHTHALMIC | Status: AC
  Filled 2019-01-09: qty 3.5

## 2019-01-09 MED ORDER — ROCURONIUM BROMIDE 100 MG/10ML IV SOLN
INTRAVENOUS | Status: DC | PRN
  Administered 2019-01-09: 40 mg via INTRAVENOUS

## 2019-01-09 MED ORDER — LACTATED RINGERS IV SOLN
INTRAVENOUS | Status: DC | PRN
  Administered 2019-01-09 (×2): via INTRAVENOUS

## 2019-01-09 MED ORDER — LIDOCAINE HCL (CARDIAC) PF 100 MG/5ML IV SOSY
PREFILLED_SYRINGE | INTRAVENOUS | Status: DC | PRN
  Administered 2019-01-09: 60 mg via INTRATRACHEAL

## 2019-01-09 MED ORDER — LIDOCAINE 2% (20 MG/ML) 5 ML SYRINGE
INTRAMUSCULAR | Status: AC
  Filled 2019-01-09: qty 5

## 2019-01-09 MED ORDER — SODIUM CHLORIDE 0.9% IV SOLUTION
Freq: Once | INTRAVENOUS | Status: AC
  Administered 2019-01-09: 20:00:00 via INTRAVENOUS

## 2019-01-09 MED ORDER — HYDROMORPHONE HCL 1 MG/ML IJ SOLN
0.5000 mg | Freq: Once | INTRAMUSCULAR | Status: AC
  Administered 2019-01-09: 0.5 mg via INTRAVENOUS
  Filled 2019-01-09: qty 1

## 2019-01-09 MED ORDER — MIDAZOLAM HCL 5 MG/5ML IJ SOLN
INTRAMUSCULAR | Status: DC | PRN
  Administered 2019-01-09: 2 mg via INTRAVENOUS

## 2019-01-09 MED ORDER — SUCCINYLCHOLINE CHLORIDE 200 MG/10ML IV SOSY
PREFILLED_SYRINGE | INTRAVENOUS | Status: AC
  Filled 2019-01-09: qty 20

## 2019-01-09 SURGICAL SUPPLY — 31 items
APPLICATOR COTTON TIP 6 STRL (MISCELLANEOUS) IMPLANT
APPLICATOR COTTON TIP 6IN STRL (MISCELLANEOUS) ×5
CABLE HIGH FREQUENCY MONO STRZ (ELECTRODE) IMPLANT
CATH ROBINSON RED A/P 16FR (CATHETERS) ×5 IMPLANT
DERMABOND ADVANCED (GAUZE/BANDAGES/DRESSINGS) ×2
DERMABOND ADVANCED .7 DNX12 (GAUZE/BANDAGES/DRESSINGS) ×3 IMPLANT
DURAPREP 26ML APPLICATOR (WOUND CARE) ×5 IMPLANT
GLOVE BIO SURGEON STRL SZ 6.5 (GLOVE) ×4 IMPLANT
GLOVE BIO SURGEONS STRL SZ 6.5 (GLOVE) ×1
GLOVE BIOGEL PI IND STRL 7.0 (GLOVE) ×9 IMPLANT
GLOVE BIOGEL PI INDICATOR 7.0 (GLOVE) ×6
GLOVE ECLIPSE 7.0 STRL STRAW (GLOVE) ×5 IMPLANT
GOWN STRL REUS W/ TWL LRG LVL3 (GOWN DISPOSABLE) ×6 IMPLANT
GOWN STRL REUS W/TWL LRG LVL3 (GOWN DISPOSABLE) ×4
KIT TURNOVER KIT B (KITS) ×5 IMPLANT
NS IRRIG 1000ML POUR BTL (IV SOLUTION) ×5 IMPLANT
PACK LAPAROSCOPY BASIN (CUSTOM PROCEDURE TRAY) ×5 IMPLANT
PACK TRENDGUARD 450 HYBRID PRO (MISCELLANEOUS) IMPLANT
POUCH SPECIMEN RETRIEVAL 10MM (ENDOMECHANICALS) ×4 IMPLANT
PROTECTOR NERVE ULNAR (MISCELLANEOUS) ×10 IMPLANT
SET IRRIG TUBING LAPAROSCOPIC (IRRIGATION / IRRIGATOR) ×2 IMPLANT
SET TUBE SMOKE EVAC HIGH FLOW (TUBING) ×5 IMPLANT
SHEARS HARMONIC ACE PLUS 36CM (ENDOMECHANICALS) ×2 IMPLANT
SLEEVE ENDOPATH XCEL 5M (ENDOMECHANICALS) ×5 IMPLANT
SUT MNCRL AB 4-0 PS2 18 (SUTURE) ×5 IMPLANT
SUT VICRYL 0 UR6 27IN ABS (SUTURE) ×2 IMPLANT
TOWEL GREEN STERILE FF (TOWEL DISPOSABLE) ×10 IMPLANT
TRAY FOLEY W/BAG SLVR 14FR (SET/KITS/TRAYS/PACK) IMPLANT
TRENDGUARD 450 HYBRID PRO PACK (MISCELLANEOUS) ×5
TROCAR XCEL NON-BLD 11X100MML (ENDOMECHANICALS) ×2 IMPLANT
TROCAR XCEL NON-BLD 5MMX100MML (ENDOMECHANICALS) ×5 IMPLANT

## 2019-01-09 NOTE — Discharge Instructions (Addendum)
Laparoscopic Surgery - Care After Laparoscopy is a surgical procedure. It is used to diagnose and treat diseases inside the belly(abdomen). It is usually a brief, common, and relatively simple procedure. The laparoscopeis a thin, lighted, pencil-sized instrument. It is like a telescope. It is inserted into your abdomen through a small cut (incision). Your caregiver can look at the organs inside your body through this instrument.  She can see if there is anything abnormal. Laparoscopy can be done either in a hospital or outpatient clinic. You may be given a mild sedative to help you relax before the procedure. Once in the operating room, you will be given a drug to make you sleep (general anesthesia). Laparoscopy usually lasts about 1 hour. After the procedure, you will be monitored in a recovery area until you are stable and doing well. Once you are home, it may take 3 to 7 days to fully recover.   Laparoscopy has relatively few risks. Your caregiver will discuss the risks with you before the procedure. Some problems that can occur include: RISKS AND COMPLICATIONS   Allergies to medicines.  Difficulty breathing.  Bleeding.  Infection.  Damage to other surrounding structures HOME CARE INSTRUCTIONS   Infection.  Bleeding.  Damage to other organs.  Anesthetic side effects.   Need for additional procedures such as open procedures/laparotomy PROCEDURE Once you receive anesthesia, your surgeon inflates the abdomen with a harmless gas (carbon dioxide). This makes the organs easier to see. The laparoscope is inserted into the abdomen through a small incision. This allows your surgeon to see into the abdomen. Other small instruments are also inserted into the abdomen through other small openings. Many surgeons attach a video camera to the laparoscope to enlarge the view. During a laparoscopy, the surgeon may be looking for inflammation, infection, or cancer.  The surgeon may also need to take  out certain organs or take tissue samples (biopsies). The specimens are sent to a specialist in looking at cells and tissue samples (pathologist). The pathologist examines them under a microscope to help to diagnose or confirm a disease. AFTER THE PROCEDURE   The incisions are closed with stitches (sutures) and Dermabond. Because these incisions are small (usually less than 1/2 inch), there is usually minimal discomfort after the procedure. There may also be discomfort from the instrument placement incisions in the abdomen. You will be given pain medicine to ease any discomfort.  You will rest in a recovery room for 1-2 hours until you are stable and doing well.  You may have some mild discomfort in the throat. This is from the tube placed in your throat while you were sleeping.  You may experience discomfort in the shoulder area from some trapped air between the liver and diaphragm. This sensation is normal and will slowly go away on its own.  The recovery time is shortened as long as there are no complications.  You will rest in a recovery room until stable and doing well. As long as there are no complications, you may be allowed to go home. Someone will need to drive you home and be with you for at least 24 hours once home. FINDING OUT THE RESULTS You will be called with the results of the pathology and will discuss these results with  your caregiver during your postoperative appointment. Do not assume everything is normal if you have not heard from your caregiver or the medical facility. It is important for you to follow up on all of your results. HOME  CARE INSTRUCTIONS   Take all medicines as directed.  Only take over-the-counter or prescription medicines for pain, discomfort, or fever as directed by your caregiver.  Resume daily activities as directed.  Showers are preferred over baths.  You may resume sexual activities in 1 week or as directed.  Do not drive while taking  narcotics. SEEK MEDICAL CARE IF:  There is increasing abdominal pain.  You feel lightheaded or faint.  You have the chills.  You have an oral temperature above 102 F (38.9 C).  There is pus-like (purulent) drainage from any of the wounds.  You are unable to pass gas or have a bowel movement.  You feel sick to your stomach (nauseous) or throw up (vomit). MAKE SURE YOU:   Understand these instructions.  Will watch your condition.  Will get help right away if you are not doing well or get worse.  ExitCare Patient Information 2013 [place]   Ruptured Ectopic Pregnancy  An ectopic pregnancy is when a fertilized egg attaches (implants) outside of the uterus, usually in a fallopian tube. A ruptured ectopic pregnancy is when the fallopian tube tears or bursts. This results in internal bleeding, intense abdominal pain, and sometimes, vaginal bleeding. Most ectopic pregnancies occur in the fallopian tube. In rare cases, it may occur on the ovary, intestine, pelvis, or cervix. An ectopic pregnancy does not have the ability to develop into a normal, healthy baby. A ruptured ectopic pregnancy can affect your ability to have children (fertility), depending on damage it causes to your reproductive organs. Ruptured ectopic pregnancy is a medical emergency. If not treated immediately, it can lead to blood loss, shock, or even death. What are the causes? Most ectopic pregnancies are caused by damage to the fallopian tubes. The damage prevents the fertilized egg from implanting in the uterus. In some cases, the cause may not be known. What increases the risk? You are at increased risk for an ectopic pregnancy if:  You have had a previous ectopic pregnancy.  You have had previous fallopian tube surgery.  You have had previous surgery to have the fallopian tubes tied (tubal ligation).  You have had infertility treatments or have a history of infertility.  You have been exposed to  DES. DES is a medicine that was used until 1971 and had effects on babies whose mothers took the medicine.  You use an IUD (intrauterine device) for birth control.  You use progestin-only oral contraception for birth control.  You have a history of pelvic inflammatory disease (PID).  You have a history of endometriosis.  You smoke.  You became sexually active before 33 years of age.  You have multiple sexual partners. What are the signs or symptoms? Symptoms of a ruptured ectopic pregnancy and internal bleeding may include:  Sudden, severe pain in the abdomen and pelvis.  Dizziness or fainting.  Pain in the shoulder area.  Vaginal bleeding. How is this diagnosed? This condition is diagnosed based on your medical history, symptoms, a physical exam, and tests, which may include:  A pregnancy test.  An ultrasound.  Measuring the levels of the pregnancy hormone in the bloodstream.  Taking a sample of tissue from the uterus (dilation and curettage, D&C).  Surgery to visually examine the inside of the abdomen using a lighted tube (laparoscopy). How is this treated? This condition is treated with IV fluids and emergency surgery to remove the ectopic pregnancy and repair the area where the rupture occured. If you have lost a lot  of blood, you may need a blood transfusion. If you are Rh negative and your baby's father is Rh positive, or the Rh type of the father is unknown, you may receive a Rho (D) immune globulin shot. This is to prevent Rh problems in future pregnancies. Additional medicines may be given. Get help right away if:  You are taking medicines to treat an ectopic pregnancy and you develop symptoms of a rupture. These include: ? Fever or chills. ? Shoulder pain. ? Vaginal bleeding. ? Nausea and vomiting. ? Severe abdominal pain or cramping. ? Feeling light-headed or fainting. Summary  An ectopic pregnancy is when a fertilized egg attaches (implants) outside of  the uterus, usually in a fallopian tube. A ruptured ectopic pregnancy is when the fallopian tube tears or bursts.  Ruptured ectopic pregnancy is a medical emergency. If not treated immediately, it can lead to blood loss, shock, or even death.  This condition is treated with IV fluids and emergency surgery to remove the ectopic pregnancy and repair the area where the rupture occured. If you have lost a lot of blood, you may need a blood transfusion. This information is not intended to replace advice given to you by your health care provider. Make sure you discuss any questions you have with your health care provider. Document Released: 06/11/2000 Document Revised: 05/27/2017 Document Reviewed: 09/01/2016 Elsevier Patient Education  2020 [employer].

## 2019-01-09 NOTE — MAU Note (Signed)
Pt arrived to [facility] via EMS with confirmed ectopic pregnancy.. Pt c/o right lower abd. Pain. Pt also reports vag bleeding since last month.

## 2019-01-09 NOTE — ED Triage Notes (Signed)
Heavy menstrual period since the 25th of June.  Has been passing blood clots. Constant pain to LLQ.

## 2019-01-09 NOTE — Anesthesia Preprocedure Evaluation (Addendum)
Anesthesia Evaluation  Patient identified by MRN, date of birth, ID band Patient awake    Reviewed: Allergy & Precautions, NPO status , Patient's Chart, lab work & pertinent test results  History of Anesthesia Complications Negative for: history of anesthetic complications  Airway Mallampati: II  TM Distance: >3 FB Neck ROM: Full    Dental  (+) Dental Advisory Given, Teeth Intact   Pulmonary Current Smoker (marijuana), former smoker,    breath sounds clear to auscultation       Cardiovascular negative cardio ROS   Rhythm:Regular Rate:Normal     Neuro/Psych negative neurological ROS  negative psych ROS   GI/Hepatic negative GI ROS, (+)     substance abuse  marijuana use,   Endo/Other   Obesity   Renal/GU negative Renal ROS     Musculoskeletal negative musculoskeletal ROS (+)   Abdominal   Peds  Hematology  (+) anemia ,   Anesthesia Other Findings   Reproductive/Obstetrics  Ruptured ectopic pregnancy Previous tubal ligation                            Anesthesia Physical Anesthesia Plan  ASA: II and emergent  Anesthesia Plan: General   Post-op Pain Management:    Induction: Intravenous and Rapid sequence  PONV Risk Score and Plan: 4 or greater and Treatment may vary due to age or medical condition, Ondansetron, Midazolam and Dexamethasone  Airway Management Planned: Oral ETT  Additional Equipment: None  Intra-op Plan:   Post-operative Plan: Extubation in OR  Informed Consent: I have reviewed the patients History and Physical, chart, labs and discussed the procedure including the risks, benefits and alternatives for the proposed anesthesia with the patient or authorized representative who has indicated his/her understanding and acceptance.     Dental advisory given  Plan Discussed with: CRNA and Anesthesiologist  Anesthesia Plan Comments:        Anesthesia  Quick Evaluation

## 2019-01-09 NOTE — ED Provider Notes (Signed)
[facility] EMERGENCY DEPARTMENT Provider Note   CSN: [id number] Arrival date & time: 01/09/19  1433    History   Chief Complaint Chief Complaint  Patient presents with   Abdominal Pain    HPI Rose Hunt is a 33 y.o. female who presents to the ED today complaining of gradual onset, constant, achy/cramping, RLQ abdominal pain and suprapubic pain x 3 weeks. Pt also complains of consistent vaginal bleeding since 06/25. Pt states she has been going through 4-5 heavy pads per day; she has also been passing large blood clots. The bleeding eased off a little bit but has been present for the past 3 weeks. Pt states that when she urinates she has a pressure sensation to her suprapubic area. She last had intercourse about 1 month ago prior to the bleeding, did not use protection. Pt denies chance of pregnancy due to hx of tubal ligation 13 years ago. Denies fever, chills, nausea, vomiting, diarrhea, constipation, dysuria, frequency, vaginal discharge, vaginal itching, pelvic pain.       Past Medical History:  Diagnosis Date   Medical history non-contributory     Patient Active Problem List   Diagnosis Date Noted   BV (bacterial vaginosis) 01/10/2019   Ruptured right tubal ectopic pregnancy causing hemoperitoneum 01/09/2019   History of bilateral tubal ligation 01/09/2019   Trichomonal vaginitis 01/09/2019    Past Surgical History:  Procedure Laterality Date   IUD REMOVAL     TUBAL LIGATION  2007     OB History    Gravida  4   Para  2   Term  2   Preterm      AB  1   Living  2     SAB      TAB  1   Ectopic      Multiple      Live Births               Home Medications    Prior to Admission medications   Medication Sig Start Date End Date Taking? Authorizing Provider  docusate sodium (COLACE) 100 MG capsule Take 1 capsule (100 mg total) by mouth 2 (two) times daily as needed for mild constipation or moderate constipation. 01/10/19    Anyanwu, Jethro Bastos, MD  ibuprofen (ADVIL) 600 MG tablet Take 1 tablet (600 mg total) by mouth every 6 (six) hours as needed for headache, mild pain, moderate pain or cramping. 01/10/19   Anyanwu, Jethro Bastos, MD  metroNIDAZOLE (FLAGYL) 500 MG tablet Take 1 tablet (500 mg total) by mouth 2 (two) times daily. 01/09/19   Tanda Rockers, PA-C  oxyCODONE-acetaminophen (PERCOCET/ROXICET) 5-325 MG tablet Take 1 tablet by mouth every 4 (four) hours as needed for severe pain. 01/10/19   Anyanwu, Jethro Bastos, MD    Family History Family History  Problem Relation Age of Onset   Diabetes Mother    Hypertension Mother     Social History Social History   Tobacco Use   Smoking status: Former Smoker   Smokeless tobacco: Never Used  Substance Use Topics   Alcohol use: Yes    Comment: occ   Drug use: Yes    Types: Marijuana     Allergies   Patient has no known allergies.   Review of Systems Review of Systems  Constitutional: Negative for chills and fever.  HENT: Negative for congestion.   Respiratory: Negative for cough and shortness of breath.   Cardiovascular: Negative for chest pain and palpitations.  Gastrointestinal: Positive  for abdominal pain. Negative for constipation, diarrhea, nausea and vomiting.  Genitourinary: Positive for menstrual problem and vaginal bleeding. Negative for dysuria, flank pain, frequency, pelvic pain, vaginal discharge and vaginal pain.  Musculoskeletal: Negative for myalgias.  Skin: Negative for rash.  Neurological: Positive for light-headedness. Negative for headaches.     Physical Exam Updated Vital Signs BP (!) 130/100 (BP Location: Left Arm)    Pulse (!) 112    Temp 98.4 F (36.9 C) (Oral)    Resp 20    Ht 5\' 5"  (1.651 m)    Wt 100.2 kg    SpO2 100%    BMI 36.78 kg/m   Physical Exam Vitals signs and nursing note reviewed.  Constitutional:      Appearance: She is not ill-appearing.  HENT:     Head: Normocephalic and atraumatic.  Eyes:      Conjunctiva/sclera: Conjunctivae normal.  Neck:     Musculoskeletal: Neck supple.  Cardiovascular:     Rate and Rhythm: Normal rate and regular rhythm.  Pulmonary:     Effort: Pulmonary effort is normal.     Breath sounds: Normal breath sounds. No wheezing, rhonchi or rales.  Abdominal:     General: Abdomen is flat.     Palpations: Abdomen is soft.     Tenderness: There is abdominal tenderness in the right lower quadrant and suprapubic area. There is no guarding or rebound. Negative signs include Rovsing's sign, McBurney's sign, psoas sign and obturator sign.  Genitourinary:    Comments: Chaperone present for exam. No rashes, lesions, or tenderness to external genitalia. No erythema, injury, or tenderness to vaginal mucosa. Moderate amount of blood to vaginal vault. No adnexal masses, tenderness, or fullness. No CMT. Skin:    General: Skin is warm and dry.  Neurological:     Mental Status: She is alert.      ED Treatments / Results  Labs (all labs ordered are listed, but only abnormal results are displayed) Labs Reviewed  WET PREP, GENITAL - Abnormal; Notable for the following components:      Result Value   Trich, Wet Prep PRESENT (*)    Clue Cells Wet Prep HPF POC PRESENT (*)    WBC, Wet Prep HPF POC MODERATE (*)    All other components within normal limits  HCG, QUANTITATIVE, PREGNANCY - Abnormal; Notable for the following components:   hCG, Beta Chain, Quant, S 2,824 (*)    All other components within normal limits  BASIC METABOLIC PANEL - Abnormal; Notable for the following components:   CO2 21 (*)    Glucose, Bld 108 (*)    All other components within normal limits  CBC WITH DIFFERENTIAL/PLATELET - Abnormal; Notable for the following components:   RBC 3.19 (*)    Hemoglobin 10.5 (*)    HCT 30.7 (*)    All other components within normal limits  URINALYSIS, ROUTINE W REFLEX MICROSCOPIC - Abnormal; Notable for the following components:   Specific Gravity, Urine >1.030  (*)    Hgb urine dipstick LARGE (*)    Ketones, ur 15 (*)    All other components within normal limits  URINALYSIS, MICROSCOPIC (REFLEX) - Abnormal; Notable for the following components:   Bacteria, UA RARE (*)    Trichomonas, UA PRESENT (*)    All other components within normal limits  CBC - Abnormal; Notable for the following components:   RBC 2.96 (*)    Hemoglobin 9.9 (*)    HCT 28.2 (*)  All other components within normal limits  SARS CORONAVIRUS 2 (HOSPITAL ORDER, [place] [facility])  HEPATIC FUNCTION PANEL  RPR  HIV ANTIBODY (ROUTINE TESTING W REFLEX)  TYPE AND SCREEN  PREPARE RBC (CROSSMATCH)  ABO/RH  GC/CHLAMYDIA PROBE AMP ([place]) NOT AT [facility]  SURGICAL PATHOLOGY    EKG None  Radiology US Ob Comp < 14 Wks  Result Date: 01/09/2019 CLINICAL DATA:  Rad adnexal pain. History of tubal ligation. Vaginal bleeding. EXAM: OBSTETRIC <14 WK [place] AND TRANSVAGINAL OB US TECHNIQUE: Both transabdominal and transvaginal ultrasound examinations were performed for complete evaluation of the gestation as well as the maternal uterus, adnexal regions, and pelvic cul-de-sac. Transvaginal technique was performed to assess early pregnancy. COMPARISON:  None. FINDINGS: Intrauterine gestational sac: None Maternal uterus/adnexae: There is an ectopic pregnancy in the right adnexa. No cardiac activity seen within the fetal pole. Both ovaries are normal in appearance. There is a rounded region of hyperechogenicity in the uterus anteriorly measuring 1 x 1 x 1 cm. There appears to be thickening of the endometrium immediately adjacent to this rounded region seen on cine images. IMPRESSION: 1. Right ectopic pregnancy. The gestational sac associated with the ectopic pregnancy measures 1.1 x 1.4 x 1.4 cm. The surrounding soft tissue is difficult to measure accurately but appears to measure up to 5.3 by 3.4 cm. 2. There is a moderate amount of complex fluid in the pelvis, suggesting  possible blood products given the ectopic pregnancy. 3. Possible hyperechoic fibroid adjacent to some thickened endometrium in the uterus. Recommend a follow-up ultrasound in 6-12 weeks to better evaluate this region after resolution of the ectopic pregnancy. Findings called to and discussed with Dr. Rogene Houston Electronically Signed   By: Dorise Bullion III M.D   On: 01/09/2019 17:25   US Ob Transvaginal  Result Date: 01/09/2019 CLINICAL DATA:  Rad adnexal pain. History of tubal ligation. Vaginal bleeding. EXAM: OBSTETRIC <14 WK [place] AND TRANSVAGINAL OB US TECHNIQUE: Both transabdominal and transvaginal ultrasound examinations were performed for complete evaluation of the gestation as well as the maternal uterus, adnexal regions, and pelvic cul-de-sac. Transvaginal technique was performed to assess early pregnancy. COMPARISON:  None. FINDINGS: Intrauterine gestational sac: None Maternal uterus/adnexae: There is an ectopic pregnancy in the right adnexa. No cardiac activity seen within the fetal pole. Both ovaries are normal in appearance. There is a rounded region of hyperechogenicity in the uterus anteriorly measuring 1 x 1 x 1 cm. There appears to be thickening of the endometrium immediately adjacent to this rounded region seen on cine images. IMPRESSION: 1. Right ectopic pregnancy. The gestational sac associated with the ectopic pregnancy measures 1.1 x 1.4 x 1.4 cm. The surrounding soft tissue is difficult to measure accurately but appears to measure up to 5.3 by 3.4 cm. 2. There is a moderate amount of complex fluid in the pelvis, suggesting possible blood products given the ectopic pregnancy. 3. Possible hyperechoic fibroid adjacent to some thickened endometrium in the uterus. Recommend a follow-up ultrasound in 6-12 weeks to better evaluate this region after resolution of the ectopic pregnancy. Findings called to and discussed with Dr. Rogene Houston Electronically Signed   By: Dorise Bullion III M.D   On:  01/09/2019 17:25    Procedures .Critical Care Performed by: Eustaquio Maize, PA-C Authorized by: Eustaquio Maize, PA-C   Critical care provider statement:    Critical care time (minutes):  40   Critical care was time spent personally by me on the following activities:  Discussions  with consultants, evaluation of patient's response to treatment, examination of patient, ordering and performing treatments and interventions, ordering and review of laboratory studies, ordering and review of radiographic studies, pulse oximetry, re-evaluation of patient's condition, obtaining history from patient or surrogate and review of old charts   (including critical care time)  Medications Ordered in ED Medications  fentaNYL (SUBLIMAZE) injection 25-50 mcg (25 mcg Intravenous Given 01/10/19 0020)  promethazine (PHENERGAN) injection 6.25-12.5 mg (has no administration in time range)  fentaNYL (SUBLIMAZE) 100 MCG/2ML injection (has no administration in time range)  oxyCODONE (Oxy IR/ROXICODONE) 5 MG immediate release tablet (has no administration in time range)  sodium chloride 0.9 % bolus 1,000 mL (0 mLs Intravenous Stopped 01/09/19 1722)  acetaminophen (TYLENOL) tablet 650 mg (650 mg Oral Given 01/09/19 1700)  metroNIDAZOLE (FLAGYL) tablet 500 mg (500 mg Oral Given 01/09/19 1700)  0.9 %  sodium chloride infusion (Manually program via Guardrails IV Fluids) ( Intravenous New Bag/Given 01/09/19 2023)  ceFAZolin (ANCEF) IVPB 2g/100 mL premix (2 g Intravenous Given 01/09/19 2236)  HYDROmorphone (DILAUDID) injection 0.5 mg (0.5 mg Intravenous Given 01/09/19 2049)  oxyCODONE (Oxy IR/ROXICODONE) immediate release tablet 5 mg (5 mg Oral Given 01/10/19 0030)    Or  oxyCODONE (ROXICODONE) 5 MG/5ML solution 5 mg ( Oral See Alternative 01/10/19 0030)  bupivacaine (MARCAINE) 0.5 % injection (has no administration in time range)  fentaNYL (SUBLIMAZE) 250 MCG/5ML injection (has no administration in time range)  midazolam  (VERSED) 2 MG/2ML injection (has no administration in time range)  fentaNYL (SUBLIMAZE) 250 MCG/5ML injection (has no administration in time range)  succinylcholine (ANECTINE) 200 MG/10ML syringe (has no administration in time range)  lidocaine 20 MG/ML injection (has no administration in time range)  rocuronium bromide 100 MG/10ML SOSY (has no administration in time range)  ePHEDrine 5 MG/ML injection (has no administration in time range)  phenylephrine 0.4-0.9 MG/10ML-% injection (has no administration in time range)  dexamethasone (DECADRON) 10 MG/ML injection (has no administration in time range)  ondansetron (ZOFRAN) 4 MG/2ML injection (has no administration in time range)  sodium chloride (PF) 0.9 % injection (has no administration in time range)  artificial tears (LACRILUBE) ophthalmic ointment (has no administration in time range)     Initial Impression / Assessment and Plan / ED Course  I have reviewed the triage vital signs and the nursing notes.  Pertinent labs & imaging results that were available during my care of the patient were reviewed by me and considered in my medical decision making (see chart for details).  33 year old female presenting with RLQ abdominal pain and vaginal bleeding x 3 weeks. Pt reports tubal ligation 13 years ago; she is not concerned about pregnancy today however had unprotected intercourse about 1 month ago. No hx of heavy menstrual cycles. Felt a little lightheaded yesterday but without syncope. Vital signs stable besides mild tachycardia; 1 L fluid bolus given. Will perform pelvic exam at this time. Baseline bloodwork also obtained including beta hcg. Concern for ectopic pregnancy even with tubal ligation hx.   Beta hcg elevated; ultrasound ordered at this time to rule out ectopic. Trich discovered on urine sample. Pelvic exam with mild amount of blood in vaginal vault; unable to visualize Os. No vaginal discharge appreciated.   Wet prep positive for  trich as well as BV. 500 mg Flagyl given in the ED as well as Tylenol for pain given positive pregnancy test. Still awaiting ultrasound results.   Ultrasound tech approached me; reports that pt does  have an ectopic today. Still awaiting for final read. Will call over to MAU for transfer at this time. Pt continues to be hemodynamically stable.   Patient to be transferred to MAU; Dr. Macon LargeAnyanwu accepting.   Clinical Course as of Jan 10 35  Tue Jan 09, 2019  1617 HCG, Newman NickelsBeta Chain, Quant, S(!): 2,824 [MV]  1617 Trichomonas, UA(!): PRESENT [MV]  1716 Discussed case with Dr. Macon LargeAnyanwu who agrees to accept patient for transfer to MAU   [MV]    Clinical Course User Index [MV] Tanda RockersVenter, Devinn Hurwitz, PA-C         Final Clinical Impressions(s) / ED Diagnoses   Final diagnoses:  Ectopic pregnancy, unspecified location, unspecified whether intrauterine pregnancy present  Bacterial vaginosis  Trichomoniasis    ED Discharge Orders         Ordered    docusate sodium (COLACE) 100 MG capsule  2 times daily PRN     01/10/19 0023    ibuprofen (ADVIL) 600 MG tablet  Every 6 hours PRN     01/10/19 0023    oxyCODONE-acetaminophen (PERCOCET/ROXICET) 5-325 MG tablet  Every 4 hours PRN     01/10/19 0023    Discharge patient    Comments: Discharge to home as per PACU criteria   01/10/19 0023    metroNIDAZOLE (FLAGYL) 500 MG tablet  2 times daily     01/09/19 1718           Tanda RockersVenter, Myisha Pickerel, PA-C 01/10/19 0036    Sabas SousBero, Michael M, MD 01/12/19 [phone]

## 2019-01-09 NOTE — Anesthesia Procedure Notes (Signed)
Procedure Name: Intubation Date/Time: 01/09/2019 10:42 PM Performed by: Clovis Cao, CRNA Pre-anesthesia Checklist: Patient identified, Emergency Drugs available, Suction available, Patient being monitored and Timeout performed Patient Re-evaluated:Patient Re-evaluated prior to induction Oxygen Delivery Method: Circle system utilized Preoxygenation: Pre-oxygenation with 100% oxygen Induction Type: IV induction, Rapid sequence and Cricoid Pressure applied Laryngoscope Size: Miller and 2 Grade View: Grade II Tube type: Oral Tube size: 7.0 mm Number of attempts: 1 Airway Equipment and Method: Stylet Placement Confirmation: ETT inserted through vocal cords under direct vision,  positive ETCO2 and breath sounds checked- equal and bilateral Secured at: 21 cm Tube secured with: Tape Dental Injury: Teeth and Oropharynx as per pre-operative assessment

## 2019-01-09 NOTE — ED Notes (Signed)
Report given to Ginger, RN at [facility]

## 2019-01-09 NOTE — ED Notes (Signed)
ED Provider at bedside. 

## 2019-01-09 NOTE — H&P (Signed)
Preoperative History and Physical  Rose Hunt is a 33 y.o. [id number] here for surgical management of ruptured right tubal ectopic pregnancy causing hemoperitoneum. This was diagnosed earlier today at [facility], where she presented with pain.  History of tubal ligation 13 years ago.  Noted to be hemodynamically stable, but ultrasound showed large right adnexal mass and moderate hemoperitoneum. I was consulted, recommended transfer [facility] for [facility] management. On arrival, patient still reported pain but no significant preoperative concerns.  Proposed surgery: Laparoscopic bilateral salpingectomy and removal of ectopic pregnancy.  Past Medical History:  Diagnosis Date  . Medical history non-contributory    Past Surgical History:  Procedure Laterality Date  . IUD REMOVAL    . TUBAL LIGATION  2007   OB History  Gravida Para Term Preterm AB Living  4 2 2   1 2   SAB TAB Ectopic Multiple Live Births    1          # Outcome Date GA Lbr Len/2nd Weight Sex Delivery Anes PTL Lv  4 Current           3 TAB           2 Term      Vag-Spont     1 Term      Vag-Spont     Patient denies any other pertinent gynecologic issues.   No current facility-administered medications on file prior to encounter.    No current outpatient medications on file prior to encounter.   No Known Allergies  Social History:   reports that she has quit smoking. She has never used smokeless tobacco. She reports current alcohol use. She reports current drug use. Drug: Marijuana.  Family History  Problem Relation Age of Onset  . Diabetes Mother   . Hypertension Mother     Review of Systems: Pertinent items noted in HPI and remainder of comprehensive ROS otherwise negative.  PHYSICAL EXAM: Blood pressure 120/79, pulse 85, temperature 98.6 F (37 C), temperature source Oral, resp. rate 18, height 5\' 5"  (1.651 m), weight 100.2 kg, SpO2 100 %. CONSTITUTIONAL: Well-developed, well-nourished female in no  acute distress.  HENT:  Normocephalic, atraumatic, External right and left ear normal. Oropharynx is clear and moist EYES: Conjunctivae and EOM are normal. Pupils are equal, round, and reactive to light. No scleral icterus.  NECK: Normal range of motion, supple, no masses SKIN: Skin is warm and dry. No rash noted. Not diaphoretic. No erythema. No pallor. NEUROLOGIC: Alert and oriented to person, place, and time. Normal reflexes, muscle tone coordination. No cranial nerve deficit noted. PSYCHIATRIC: Normal mood and affect. Normal behavior. Normal judgment and thought content. CARDIOVASCULAR: Normal heart rate noted, regular rhythm RESPIRATORY: Effort and breath sounds normal, no problems with respiration noted ABDOMEN: Soft, moderate diffuse tenderness to palpation, some rebound and guarding noted, nondistended. PELVIC: Deferred MUSCULOSKELETAL: Normal range of motion. No edema and no tenderness. 2+ distal pulses.  Labs: Results for orders placed or performed during the hospital encounter of 01/09/19 (from the past 336 hour(s))  hCG, quantitative, pregnancy   Collection Time: 01/09/19  3:03 PM  Result Value Ref Range   hCG, Beta Chain, Quant, S 2,824 (H) <5 mIU/mL  Basic metabolic panel   Collection Time: 01/09/19  3:03 PM  Result Value Ref Range   Sodium 138 135 - 145 mmol/L   Potassium 3.8 3.5 - 5.1 mmol/L   Chloride 107 98 - 111 mmol/L   CO2 21 (L) 22 - 32 mmol/L  Glucose, Bld 108 (H) 70 - 99 mg/dL   BUN 11 6 - 20 mg/dL   Creatinine, Ser 0.85 0.44 - 1.00 mg/dL   Calcium 9.0 8.9 - 10.3 mg/dL   GFR calc non Af Amer >60 >60 mL/min   GFR calc Af Amer >60 >60 mL/min   Anion gap 10 5 - 15  CBC with Differential   Collection Time: 01/09/19  3:03 PM  Result Value Ref Range   WBC 7.5 4.0 - 10.5 K/uL   RBC 3.19 (L) 3.87 - 5.11 MIL/uL   Hemoglobin 10.5 (L) 12.0 - 15.0 g/dL   HCT 30.7 (L) 36.0 - 46.0 %   MCV 96.2 80.0 - 100.0 fL   MCH 32.9 26.0 - 34.0 pg   MCHC 34.2 30.0 - 36.0 g/dL    RDW 13.2 11.5 - 15.5 %   Platelets 269 150 - 400 K/uL   nRBC 0.0 0.0 - 0.2 %   Neutrophils Relative % 51 %   Neutro Abs 3.9 1.7 - 7.7 K/uL   Lymphocytes Relative 37 %   Lymphs Abs 2.8 0.7 - 4.0 K/uL   Monocytes Relative 9 %   Monocytes Absolute 0.7 0.1 - 1.0 K/uL   Eosinophils Relative 2 %   Eosinophils Absolute 0.1 0.0 - 0.5 K/uL   Basophils Relative 1 %   Basophils Absolute 0.0 0.0 - 0.1 K/uL   Immature Granulocytes 0 %   Abs Immature Granulocytes 0.02 0.00 - 0.07 K/uL  Urinalysis, Routine w reflex microscopic   Collection Time: 01/09/19  3:03 PM  Result Value Ref Range   Color, Urine YELLOW YELLOW   APPearance CLEAR CLEAR   Specific Gravity, Urine >1.030 (H) 1.005 - 1.030   pH 6.0 5.0 - 8.0   Glucose, UA NEGATIVE NEGATIVE mg/dL   Hgb urine dipstick LARGE (A) NEGATIVE   Bilirubin Urine NEGATIVE NEGATIVE   Ketones, ur 15 (A) NEGATIVE mg/dL   Protein, ur NEGATIVE NEGATIVE mg/dL   Nitrite NEGATIVE NEGATIVE   Leukocytes,Ua NEGATIVE NEGATIVE  Urinalysis, Microscopic (reflex)   Collection Time: 01/09/19  3:03 PM  Result Value Ref Range   RBC / HPF 11-20 0 - 5 RBC/hpf   WBC, UA 6-10 0 - 5 WBC/hpf   Bacteria, UA RARE (A) NONE SEEN   Squamous Epithelial / LPF 0-5 0 - 5   Trichomonas, UA PRESENT (A) NONE SEEN  Hepatic function panel   Collection Time: 01/09/19  3:03 PM  Result Value Ref Range   Total Protein 7.2 6.5 - 8.1 g/dL   Albumin 3.9 3.5 - 5.0 g/dL   AST 23 15 - 41 U/L   ALT 17 0 - 44 U/L   Alkaline Phosphatase 56 38 - 126 U/L   Total Bilirubin 0.4 0.3 - 1.2 mg/dL   Bilirubin, Direct <0.1 0.0 - 0.2 mg/dL   Indirect Bilirubin NOT CALCULATED 0.3 - 0.9 mg/dL  Wet prep, genital   Collection Time: 01/09/19  4:22 PM   Specimen: Cervical/Vaginal swab  Result Value Ref Range   Yeast Wet Prep HPF POC NONE SEEN NONE SEEN   Trich, Wet Prep PRESENT (A) NONE SEEN   Clue Cells Wet Prep HPF POC PRESENT (A) NONE SEEN   WBC, Wet Prep HPF POC MODERATE (A) NONE SEEN   Sperm  NONE SEEN   SARS Coronavirus 2 (CEPHEID - Performed in [place] hospital lab), [facility] Order   Collection Time: 01/09/19  5:04 PM   Specimen: Nasopharyngeal Swab  Result Value Ref Range  SARS Coronavirus 2 NEGATIVE NEGATIVE  Type and screen [facility]   Collection Time: 01/09/19  7:37 PM  Result Value Ref Range   ABO/RH(D) O POS    Antibody Screen      NEG Performed at [facility], [address] [place], [place] [id number]    Sample Expiration PENDING     Imaging Studies: [place] Ob Comp < 14 Wks  Result Date: 01/09/2019 CLINICAL DATA:  Rad adnexal pain. History of tubal ligation. Vaginal bleeding. EXAM: OBSTETRIC <14 WK US AND TRANSVAGINAL OB US TECHNIQUE: Both transabdominal and transvaginal ultrasound examinations were performed for complete evaluation of the gestation as well as the maternal uterus, adnexal regions, and pelvic cul-de-sac. Transvaginal technique was performed to assess early pregnancy. COMPARISON:  None. FINDINGS: Intrauterine gestational sac: None Maternal uterus/adnexae: There is an ectopic pregnancy in the right adnexa. No cardiac activity seen within the fetal pole. Both ovaries are normal in appearance. There is a rounded region of hyperechogenicity in the uterus anteriorly measuring 1 x 1 x 1 cm. There appears to be thickening of the endometrium immediately adjacent to this rounded region seen on cine images. IMPRESSION: 1. Right ectopic pregnancy. The gestational sac associated with the ectopic pregnancy measures 1.1 x 1.4 x 1.4 cm. The surrounding soft tissue is difficult to measure accurately but appears to measure up to 5.3 by 3.4 cm. 2. There is a moderate amount of complex fluid in the pelvis, suggesting possible blood products given the ectopic pregnancy. 3. Possible hyperechoic fibroid adjacent to some thickened endometrium in the uterus. Recommend a follow-up ultrasound in 6-12 weeks to better evaluate this region after resolution of the  ectopic pregnancy. Findings called to and discussed with Dr. Deretha EmoryZackowski Electronically Signed   By: Gerome Samavid  Fleeger III M.D   On: 01/09/2019 17:25   [place] Ob Transvaginal  Result Date: 01/09/2019 CLINICAL DATA:  Rad adnexal pain. History of tubal ligation. Vaginal bleeding. EXAM: OBSTETRIC <14 WK US AND TRANSVAGINAL OB US TECHNIQUE: Both transabdominal and transvaginal ultrasound examinations were performed for complete evaluation of the gestation as well as the maternal uterus, adnexal regions, and pelvic cul-de-sac. Transvaginal technique was performed to assess early pregnancy. COMPARISON:  None. FINDINGS: Intrauterine gestational sac: None Maternal uterus/adnexae: There is an ectopic pregnancy in the right adnexa. No cardiac activity seen within the fetal pole. Both ovaries are normal in appearance. There is a rounded region of hyperechogenicity in the uterus anteriorly measuring 1 x 1 x 1 cm. There appears to be thickening of the endometrium immediately adjacent to this rounded region seen on cine images. IMPRESSION: 1. Right ectopic pregnancy. The gestational sac associated with the ectopic pregnancy measures 1.1 x 1.4 x 1.4 cm. The surrounding soft tissue is difficult to measure accurately but appears to measure up to 5.3 by 3.4 cm. 2. There is a moderate amount of complex fluid in the pelvis, suggesting possible blood products given the ectopic pregnancy. 3. Possible hyperechoic fibroid adjacent to some thickened endometrium in the uterus. Recommend a follow-up ultrasound in 6-12 weeks to better evaluate this region after resolution of the ectopic pregnancy. Findings called to and discussed with Dr. Deretha EmoryZackowski Electronically Signed   By: Gerome Samavid  Chard III M.D   On: 01/09/2019 17:25    Assessment: Patient Active Problem List   Diagnosis Date Noted  . Ruptured right tubal ectopic pregnancy causing hemoperitoneum 01/09/2019  . History of bilateral tubal ligation 01/09/2019  . Trichomonal vaginitis  01/09/2019    Plan:  Patient will undergo surgical management with laparoscopic bilateral salpingectomy and removal of ectopic pregnancy.  She also desires sterilization, but given her failed tubal, she asked for bilateral salpingectomy to treat the ruptured ectopic pregnancy and remove the other ligated fallopian tube.  The risks of surgery were discussed in detail with the patient including but not limited to: bleeding which may require transfusion or reoperation; infection which may require antibiotics; injury to surrounding organs which may involve bowel, bladder, ureters; need for additional procedures including laparotomy; thromboembolic phenomenon, surgical site problems and other postoperative/anesthesia complications. Likelihood of success in alleviating the patient's condition was discussed. Routine postoperative instructions will be reviewed with the patient and her family in detail after surgery.  The patient concurred with the proposed plan, giving informed written consent for the surgery.  Patient has been NPO since 1200 and she will remain NPO for procedure.  Anesthesia and OR aware.  Preoperative prophylactic antibiotics and SCDs ordered on call to the OR.   Of note, patient has trichomonal vaginitis.  Other STI tests were done in the ER and are pending.  She was told that she needs to let partner(s) know so the partner(s) can get testing and treatment. Patient and sex partner(s) should abstain from unprotected sexual activity for seven days after everyone receives appropriate treatment.  Metronidazole was prescribed for patient to complete outpatient.    To OR when ready. If patient is stable after surgery, the plan is to send her home from the PACU, this was discussed with patient.    Jaynie CollinsUGONNA  Sosie Gato, MD, FACOG Obstetrician & Gynecologist, [facility], [facility]

## 2019-01-10 ENCOUNTER — Encounter (HOSPITAL_COMMUNITY): Payer: Self-pay | Admitting: Obstetrics & Gynecology

## 2019-01-10 DIAGNOSIS — B9689 Other specified bacterial agents as the cause of diseases classified elsewhere: Secondary | ICD-10-CM | POA: Diagnosis present

## 2019-01-10 DIAGNOSIS — N731 Chronic parametritis and pelvic cellulitis: Secondary | ICD-10-CM | POA: Diagnosis present

## 2019-01-10 DIAGNOSIS — N760 Acute vaginitis: Secondary | ICD-10-CM | POA: Diagnosis present

## 2019-01-10 LAB — ABO/RH: ABO/RH(D): O POS

## 2019-01-10 LAB — GC/CHLAMYDIA PROBE AMP (~~LOC~~) NOT AT ARMC
Chlamydia: POSITIVE — AB
Neisseria Gonorrhea: NEGATIVE

## 2019-01-10 LAB — HIV ANTIBODY (ROUTINE TESTING W REFLEX): HIV Screen 4th Generation wRfx: NONREACTIVE

## 2019-01-10 LAB — RPR: RPR Ser Ql: NONREACTIVE

## 2019-01-10 MED ORDER — FENTANYL CITRATE (PF) 100 MCG/2ML IJ SOLN
INTRAMUSCULAR | Status: AC
  Filled 2019-01-10: qty 2

## 2019-01-10 MED ORDER — OXYCODONE-ACETAMINOPHEN 5-325 MG PO TABS: 1.0000 | ORAL_TABLET | ORAL | 0 refills | Status: DC | PRN

## 2019-01-10 MED ORDER — PROMETHAZINE HCL 25 MG/ML IJ SOLN: 6.2500 mg | INTRAMUSCULAR | Status: DC | PRN

## 2019-01-10 MED ORDER — IBUPROFEN 600 MG PO TABS: 600.0000 mg | ORAL_TABLET | Freq: Four times a day (QID) | ORAL | 2 refills | Status: DC | PRN

## 2019-01-10 MED ORDER — FENTANYL CITRATE (PF) 100 MCG/2ML IJ SOLN
25.0000 ug | INTRAMUSCULAR | Status: DC | PRN
  Administered 2019-01-10 (×3): 25 ug via INTRAVENOUS
  Administered 2019-01-10: 50 ug via INTRAVENOUS

## 2019-01-10 MED ORDER — DOCUSATE SODIUM 100 MG PO CAPS: 100.0000 mg | ORAL_CAPSULE | Freq: Two times a day (BID) | ORAL | 2 refills | Status: DC | PRN

## 2019-01-10 MED ORDER — OXYCODONE HCL 5 MG PO TABS
ORAL_TABLET | ORAL | Status: AC
  Filled 2019-01-10: qty 1

## 2019-01-10 MED ORDER — OXYCODONE HCL 5 MG/5ML PO SOLN: 5.0000 mg | Freq: Once | ORAL | Status: AC | PRN

## 2019-01-10 MED ORDER — OXYCODONE HCL 5 MG PO TABS
5.0000 mg | ORAL_TABLET | Freq: Once | ORAL | Status: AC | PRN
  Administered 2019-01-10: 5 mg via ORAL

## 2019-01-10 NOTE — Transfer of Care (Signed)
Immediate Anesthesia Transfer of Care Note  Patient: Rose Hunt  Procedure(s) Performed: LAPAROSCOPIC SALPINGO OOPHORECTOMY WITH REMOVAL OF ECTOPIC PREGNANCY (Right ) Laparoscopic Left Salpingectomy (Left )  Patient Location: PACU  Anesthesia Type:General  Level of Consciousness: drowsy  Airway & Oxygen Therapy: Patient Spontanous Breathing  Post-op Assessment: Report given to RN and Post -op Vital signs reviewed and stable  Post vital signs: Reviewed and stable  Last Vitals:  Vitals Value Taken Time  BP    Temp 36.4 C 01/10/19 0009  Pulse 116 01/10/19 0013  Resp 20 01/10/19 0013  SpO2 94 % 01/10/19 0013  Vitals shown include unvalidated device data.  Last Pain:  Vitals:   01/09/19 2155  TempSrc:   PainSc: 4       Patients Stated Pain Goal: 0 (52/84/13 2440)  Complications: No apparent anesthesia complications

## 2019-01-10 NOTE — Anesthesia Postprocedure Evaluation (Signed)
Anesthesia Post Note  Patient: Rose Hunt  Procedure(s) Performed: LAPAROSCOPIC SALPINGO OOPHORECTOMY WITH REMOVAL OF ECTOPIC PREGNANCY (Right ) Laparoscopic Left Salpingectomy (Left )     Patient location during evaluation: PACU Anesthesia Type: General Level of consciousness: awake and alert Pain management: pain level controlled Vital Signs Assessment: post-procedure vital signs reviewed and stable Respiratory status: spontaneous breathing, nonlabored ventilation and respiratory function stable Cardiovascular status: blood pressure returned to baseline, stable and tachycardic Postop Assessment: no apparent nausea or vomiting Anesthetic complications: no    Last Vitals:  Vitals:   01/10/19 0009 01/10/19 0015  BP: (!) 90/43 (!) 98/55  Pulse: (!) 115 (!) 109  Resp: 20 19  Temp: 36.4 C   SpO2: 95%     Last Pain:  Vitals:   01/10/19 0009  TempSrc:   PainSc: [place]

## 2019-01-10 NOTE — Op Note (Addendum)
Rose Hunt PROCEDURE DATE: 01/09/2019 - 01/10/2019  PREOPERATIVE DIAGNOSIS: Ruptured right ectopic pregnancy, history of bilateral tubal ligation POSTOPERATIVE DIAGNOSIS: Ruptured right fallopian tube ectopic pregnancy, history of bilateral tubal ligation, chronic pelvic inflammatory disease PROCEDURE: Laparoscopic right salpingoophorectomy and removal of ectopic pregnancy and left salpingectomy SURGEON:  Dr. Jaynie CollinsUgonna Anyanwu ANESTHESIOLOGY TEAM: Anesthesiologist: Beryle LatheBrock, Thomas E, MD CRNA: Claudina LickMahony, Rebecca D, CRNA  INDICATIONS: 33 y.o. [id number] here with the preoperative diagnoses as listed above.  Please refer to preoperative notes for more details. Patient was counseled regarding need for laparoscopic surgical management. Risks of surgery including bleeding which may require transfusion or reoperation, infection, injury to bowel or other surrounding organs, need for additional procedures including laparotomy and other postoperative/anesthesia complications were explained to patient.  Written informed consent was obtained.  FINDINGS:  Moderate amount of hemoperitoneum estimated to be about 300 ml of blood and clots.  There was a large complex in right adnexa with the very dilated fallopian tube containing the ectopic pregnancy, which was very adherent to sigmoid colon and also to right ovary and right pelvic side wall. It was difficult to separate to the fallopian tube from the ovary, and a lot of bleeding was encountered in this attempt. As a result, the entire adnexa was removed after freeing it from the right pelvic sidewall and sigmoid colon.  Small normal appearing uterus, left fallopian tube is status post previous ligation. Patient had moderate adhesions in the posterior cul-de-sac resembling chronic pelvic inflammatory disease.  Normal left ovary.  ANESTHESIA: General INTRAVENOUS FLUIDS: 1000 ml ESTIMATED BLOOD LOSS: 200 ml during procedure; but had 300 ml of hemoperitoneum SPECIMENS: Right  fallopian tube containing ectopic gestation which was adherent to right ovary, left fallopian tube COMPLICATIONS: None immediate  PROCEDURE IN DETAIL:  The patient was taken to the operating room where general anesthesia was administered and was found to be adequate. She also received preoperative antibiotics. She was placed in the dorsal lithotomy position, and was prepped and draped in a sterile manner.  A Foley catheter was inserted into her bladder and attached to constant drainage and a uterine manipulator was then advanced into the uterus.  After an adequate timeout was performed, attention was turned to the abdomen where an umbilical incision was made with the scalpel.  The Optiview 11-mm trocar and sleeve were then advanced without difficulty with the laparoscope under direct visualization into the abdomen.  The abdomen was then insufflated with carbon dioxide gas and adequate pneumoperitoneum was obtained.  A survey of the patient's pelvis and abdomen revealed the findings above.  Two 5-mm  left lower quadrant ports were then placed under direct visualization.  The Nezhat suction irrigator was then used to suction the hemoperitoneum and irrigate the pelvis.  Attention was then turned to the right adnexa which had the aforementioned findings. Attempts made to separate the tube and ovary caused significant bleeding that could not be controlled; this resulted in having to do a right salpingoophorectomy after freeing the complex lesion from the bowel and the pelvic sidewall.  The attachments of the right adnexal mass and bowel were lysed bluntly; and the attachments of the adnexa to the pelvic side wall and uterus were lysed using the Harmonic instrument.  There was some bleeding in the broad ligament, this was controlled with the Harmonic instrument. Good hemostasis was noted.  Attention was then turned to the previously ligated left fallopian tube, which was grasped and ligated from the underlying  mesosalpinx and uterine attachment using the  Harmonic instrument.  Good hemostasis was noted.  The specimens were placed in an EndoCatch bag and removed from the abdomen intact.  The abdomen was desufflated, and all instruments were removed.  The fascial incision of the 11-mm site was reapproximated with a 0 Vicryl stitch; and all skin incisions were closed with 4-0 Monocryl and Dermabond. The patient tolerated the procedure well.  Sponge, lap, and needle counts were correct times two.  The patient was then taken to the recovery room awake, extubated and in stable condition.   The patient will be discharged to home as per PACU criteria.  Routine postoperative instructions given.  She was prescribed Percocet, Ibuprofen and Colace.   She will follow up in the clinic in about 2-3 weeks for postoperative evaluation.   [name], MD, [place] for [employer], [place]

## 2019-01-11 ENCOUNTER — Telehealth: Payer: Self-pay

## 2019-01-11 ENCOUNTER — Other Ambulatory Visit: Payer: Self-pay | Admitting: Obstetrics & Gynecology

## 2019-01-11 ENCOUNTER — Encounter: Payer: Self-pay | Admitting: Obstetrics & Gynecology

## 2019-01-11 DIAGNOSIS — A749 Chlamydial infection, unspecified: Secondary | ICD-10-CM

## 2019-01-11 HISTORY — DX: Chlamydial infection, unspecified: A74.9

## 2019-01-11 LAB — TYPE AND SCREEN
ABO/RH(D): O POS
Antibody Screen: NEGATIVE
Unit division: 0
Unit division: 0

## 2019-01-11 LAB — BPAM RBC
Blood Product Expiration Date: 202008142359
Blood Product Expiration Date: 202008142359
Unit Type and Rh: 5100
Unit Type and Rh: 5100

## 2019-01-11 MED ORDER — AZITHROMYCIN 500 MG PO TABS: 1000.0000 mg | ORAL_TABLET | Freq: Once | ORAL | 1 refills | Status: AC

## 2019-01-11 NOTE — Telephone Encounter (Addendum)
-----   Message from Osborne Oman, MD sent at 01/11/2019  2:25 PM EDT ----- Patient has chlamydia.  Of note, patient was also diagnosed with Trichomonas in the same encounter, please ensure that she picked up her prescribed Metronidazole.  Negative testing for other STIs, but she needs to let partner(s) know so the partner(s) can get testing and treatment. Patient and sex partner(s) should abstain from unprotected sexual activity for seven days after everyone receives appropriate treatment.  Azithromycin was prescribed for patient.  Patient will need to be re-tested in about 4 weeks after treatment for repeat test of cure for both STIs. Please call to inform patient of results and recommendations, and advise to pick up prescription and take as directed.  LM for pt to please return the call in regards to results.

## 2019-01-11 NOTE — Progress Notes (Signed)
Patient has chlamydia.  Of note, patient was also diagnosed with Trichomonas in the same encounter, please ensure that she picked up her prescribed Metronidazole.  Negative testing for other STIs, but she needs to let partner(s) know so the partner(s) can get testing and treatment. Patient and sex partner(s) should abstain from unprotected sexual activity for seven days after everyone receives appropriate treatment.  Azithromycin was prescribed for patient.  Patient will need to be re-tested in about 4 weeks after treatment for repeat test of cure for both STIs. Please call to inform patient of results and recommendations, and advise to pick up prescription and take as directed.

## 2019-01-11 NOTE — Progress Notes (Signed)
    Lab Addendum  Patient has chlamydia.  Of note, patient was also diagnosed with Trichomonas in the same encounter on 01/09/19, please ensure that she picked up her prescribed Metronidazole.  Negative testing for other STIs, but she needs to let partner(s) know so the partner(s) can get testing and treatment. Patient and sex partner(s) should abstain from unprotected sexual activity for seven days after everyone receives appropriate treatment.  Azithromycin was prescribed for patient.  Patient will need to be re-tested in about 4 weeks after treatment for repeat test of cure for both STIs. Patient will be informed of results and recommendations, and advised to pick up prescriptions and take as directed.  [name], MD, [place] for [employer], [place]

## 2019-01-26 ENCOUNTER — Encounter: Payer: Self-pay | Admitting: Obstetrics & Gynecology

## 2019-01-26 ENCOUNTER — Other Ambulatory Visit: Payer: Self-pay

## 2019-01-26 ENCOUNTER — Ambulatory Visit (INDEPENDENT_AMBULATORY_CARE_PROVIDER_SITE_OTHER): Payer: Self-pay | Admitting: Obstetrics & Gynecology

## 2019-01-26 VITALS — BP 135/87 | HR 83 | Temp 98.3°F | Ht 64.0 in | Wt 221.6 lb

## 2019-01-26 DIAGNOSIS — A5901 Trichomonal vulvovaginitis: Secondary | ICD-10-CM

## 2019-01-26 DIAGNOSIS — A749 Chlamydial infection, unspecified: Secondary | ICD-10-CM

## 2019-01-26 DIAGNOSIS — B9689 Other specified bacterial agents as the cause of diseases classified elsewhere: Secondary | ICD-10-CM

## 2019-01-26 DIAGNOSIS — Z09 Encounter for follow-up examination after completed treatment for conditions other than malignant neoplasm: Secondary | ICD-10-CM

## 2019-01-26 DIAGNOSIS — Z9889 Other specified postprocedural states: Secondary | ICD-10-CM

## 2019-01-26 DIAGNOSIS — N760 Acute vaginitis: Secondary | ICD-10-CM

## 2019-01-26 MED ORDER — AZITHROMYCIN 500 MG PO TABS: 1000.0000 mg | ORAL_TABLET | Freq: Once | ORAL | 1 refills | Status: AC

## 2019-01-26 MED FILL — AZITHROMYCIN 500 MG TABLET: 500 | 1 days supply | Qty: 2 | Fill #0

## 2019-01-26 NOTE — Progress Notes (Signed)
Pt reports some abdominal cramping. She is taking ibuprofen with good relief.

## 2019-01-26 NOTE — Patient Instructions (Addendum)
Return to clinic for any scheduled appointments or for any gynecologic concerns as needed.    Preventing Sexually Transmitted Infections, Adult Sexually transmitted infections (STIs) are diseases that are passed (transmitted) from person to person through bodily fluids exchanged during sex or sexual contact. Bodily fluids include saliva, semen, blood, vaginal mucus, and urine. You may have an increased risk for developing an STI if you have unprotected oral, vaginal, or anal sex. Some common STIs include:  Herpes.  Hepatitis B.  Chlamydia.  Gonorrhea.  Syphilis.  HPV (human papillomavirus).  HIV (human immunodeficiency virus), the virus that can cause AIDS (acquired immunodeficiency syndrome). How can I protect myself from sexually transmitted infections? The only way to completely prevent STIs is not to have sex of any kind (practice abstinence). This includes oral, vaginal, or anal sex. If you are sexually active, take these actions to lower your risk of getting an STI:  Have only one sex partner (be monogamous) or limit the number of sexual partners you have.  Stay up-to-date on immunizations. Certain vaccines can lower your risk of getting certain STIs, such as: ? Hepatitis A and B vaccines. You may have been vaccinated as a young child, but likely need a booster shot as a teen or young adult. ? HPV vaccine.  Use methods that prevent the exchange of body fluids between partners (barrier protection) every time you have sex. Barrier protection can be used during oral, vaginal, or anal sex. Commonly used barrier methods include: ? Female condom. ? Female condom. ? Dental dam.  Get tested regularly for STIs. Have your sexual partner get tested regularly as well.  Avoid mixing alcohol, drugs, and sex. Alcohol and drug use can affect your ability to make good decisions and can lead to risky sexual behaviors.  Ask your health care provider about taking pre-exposure prophylaxis (PrEP)  to prevent HIV infection if you: ? Have a HIV-positive sexual partner. ? Have multiple sexual partners or partners who do not know their HIV status, and do not regularly use a condom during sex. ? Use injection drugs and share needles. Birth control pills, injections, implants, and intrauterine devices (IUDs) do not protect against STIs. To prevent both STIs and pregnancy, always use a condom with another form of birth control. Some STIs, such as herpes, are spread through skin to skin contact. A condom does not protect you from getting such STIs. If you or your partner have herpes and there is an active flare with open sores, avoid all sexual contact. Why are these changes important? Taking steps to practice safe sex protects you and others. Many STIs can be cured. However, some STIs are not curable and will affect you for the rest of your life. STIs can be passed on to another person even if you do not have symptoms. What can happen if changes are not made? Certain STIs may:  Require you to take medicine for the rest of your life.  Affect your ability to have children (your fertility).  Increase your risk for developing another STI or certain serious health conditions, such as: ? Cervical cancer. ? Head and neck cancer. ? Pelvic inflammatory disease (PID) in women. ? Organ damage or damage to other parts of your body, if the infection spreads.  Be passed to a baby during childbirth. How are sexually transmitted infections treated? If you or your partner know or think that you may have an STI:  Talk with your health care provider about what can be done to  treat it. Some STIs can be treated and cured with medicines.  For curable STIs, you and your partner should avoid sex during treatment and for several days after treatment is complete.  You and your partner should both be treated at the same time, if there is any chance that your partner is infected as well. If you get treatment but  your partner does not, your partner can re-infect you when you resume sexual contact.  Do not have unprotected sex. Where to find more information Learn more about sexually transmitted diseases and infections from:  Centers for Disease Control and Prevention: ? More information about specific STIs: SolutionApps.co.zawww.cdc.gov/std ? Find places to get sexual health counseling and treatment for free or for a low cost: gettested.TonerPromos.nocdc.gov  U.S. Department of Health and Human Services: NotebookPreviews.siwww.womenshealth.gov/publications/our-publications/fact-sheet/sexually-transmitted-infections.html Summary  The only way to completely prevent STIs is not to have sex (practice abstinence), including oral, vaginal, or anal sex.  STIs can spread through saliva, semen, blood, vaginal mucus, urine, or sexual contact.  If you do have sex, limit your number of sexual partners and use a barrier protection method every time you have sex.  If you develop an STI, get treated right away and ask your partner to be treated as well. Do not resume having sex until both of you have completed treatment for the STI. This information is not intended to replace advice given to you by your health care provider. Make sure you discuss any questions you have with your health care provider. Document Released: 06/10/2016 Document Revised: 11/18/2017 Document Reviewed: 06/10/2016 Elsevier Patient Education  2020 [employer]

## 2019-01-26 NOTE — Progress Notes (Signed)
Subjective:     Rose Hunt is a 33 y.o. [phone] female who presents to the clinic 2 weeks status post laparoscopic right salpingoophorectomy and removal of ectopic pregnancy and left salpingectomy for ruptured right fallopian tube ectopic pregnancy, history of bilateral tubal ligation, chronic pelvic inflammatory disease.  Eating a regular diet without difficulty. Bowel movements are normal. Pain is controlled with current analgesics. Medications being used: prescription NSAID's including Ibuprofen and Percocet.  Of note, patient was diagnosed with Trichomonal vaginitis and Chlamydia during the encounter on 01/09/2019; she reports taking Metronidazole but did not pick up Azithromycin yet.  The following portions of the patient's history were reviewed and updated as appropriate: allergies, current medications, past family history, past medical history, past social history, past surgical history and problem list.  Review of Systems Pertinent items noted in HPI and remainder of comprehensive ROS otherwise negative.    Objective:    BP 135/87   Pulse 83   Temp 98.3 F (36.8 C)   Ht 5\' 4"  (1.626 m)   Wt 221 lb 9.6 oz (100.5 kg)   LMP 12/21/2018 Comment: tubal ligation 13 years ago  Breastfeeding Unknown   BMI 38.04 kg/m  General:  alert and no distress  Abdomen: soft, bowel sounds active, non-tender. Incisions are C/D/I   01/09/19 Surgical pathology Fallopian tube, ectopic pregnancy, Right Tube & Ovary with Left Tube - DISRUPTED, DILATED RIGHT FALLOPIAN TUBE WITH HEMORRHAGE AND CHORIONIC VILLI, CONSISTENT WITH CLINICALLY STATED ECTOPIC PREGNANCY - OVARY WITH CORPUS LUTEUM - BENIGN UNREMARKABLE LEFT FALLOPIAN TUBE     Assessment:    Doing well postoperatively. Operative findings again reviewed. Pathology report discussed.    Plan:     1. Chlamydia infection 2. Trichomonal vaginitis 3. BV (bacterial vaginosis) Patient has chlamydia.  Of note, patient was also diagnosed with  Trichomonas in the same encounter on 01/09/19,  she has picked up her prescribed Metronidazole.  Negative testing for other STIs, but she needs to let partner(s) know so the partner(s) can get testing and treatment. Patient and sex partner(s) should abstain from unprotected sexual activity for seven days after everyone receives appropriate treatment.  Azithromycin was prescribed for patient.  Patient will need to be re-tested in about 4 weeks after treatment for repeat test of cure for both STIs. Patient informed of results and recommendations, and advised to pick up prescription and take as directed. - azithromycin (ZITHROMAX) 500 MG tablet; Take 2 tablets (1,000 mg total) by mouth once for 1 dose.  Dispense: 2 tablet; Refill: 1  4. Postop check Doing well. 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  She is not menopausal, still has normal left ovary. Had diffuse evidence of chronic PID.  Will return to work next week; advised analgesia as needed with Tylenol and Ibuprofen and to call/come to ED for any concerns.     [name], MD, [place] [facility] [employer], [place]

## 2019-02-15 ENCOUNTER — Telehealth: Payer: Self-pay | Admitting: Family Medicine

## 2019-02-15 NOTE — Telephone Encounter (Signed)
Spoke to patient about her appointment on 8/21 @ 10:20. Patient instructed to wear a face mask for the entire appointment and no visitors are allowed with her during the visit. Patient screened for covid symptoms and denied having any

## 2019-02-16 ENCOUNTER — Encounter: Payer: Self-pay | Admitting: Family Medicine

## 2019-02-16 ENCOUNTER — Ambulatory Visit: Payer: Self-pay

## 2019-02-16 ENCOUNTER — Other Ambulatory Visit: Payer: Self-pay

## 2019-02-16 ENCOUNTER — Ambulatory Visit (INDEPENDENT_AMBULATORY_CARE_PROVIDER_SITE_OTHER): Payer: Self-pay

## 2019-02-16 DIAGNOSIS — Z202 Contact with and (suspected) exposure to infections with a predominantly sexual mode of transmission: Secondary | ICD-10-CM

## 2019-02-16 DIAGNOSIS — N898 Other specified noninflammatory disorders of vagina: Secondary | ICD-10-CM

## 2019-02-16 DIAGNOSIS — Z113 Encounter for screening for infections with a predominantly sexual mode of transmission: Secondary | ICD-10-CM

## 2019-02-16 NOTE — Progress Notes (Signed)
Pt here today for TOC.  Pt explained how to obtain self swab and that we will call her with abnormal results within two to three days.  Pt verbalized understanding.   Mel Almond, RN 02/16/19

## 2019-02-19 ENCOUNTER — Telehealth: Payer: Self-pay | Admitting: Family Medicine

## 2019-02-19 NOTE — Telephone Encounter (Signed)
Pt issue addressed on 02/16/19.

## 2019-02-19 NOTE — Telephone Encounter (Signed)
Looking for results. Request nurse to call, and if she can't answer the phone she has requested a message be left in her MyChart.

## 2019-02-20 ENCOUNTER — Telehealth: Payer: Self-pay

## 2019-02-20 LAB — CERVICOVAGINAL ANCILLARY ONLY
Bacterial vaginitis: NEGATIVE
Candida vaginitis: NEGATIVE
Chlamydia: POSITIVE — AB
Neisseria Gonorrhea: POSITIVE — AB
Trichomonas: POSITIVE — AB

## 2019-02-20 MED ORDER — METRONIDAZOLE 500 MG PO TABS: 2000.0000 mg | ORAL_TABLET | Freq: Once | ORAL | 0 refills | Status: AC

## 2019-02-20 NOTE — Telephone Encounter (Signed)
Called pt and informed her that she tested + for Trich, GC/CH.  Pt stated that she saw it in her MyChart.  Pt asked how did she get the Gonorrhea when she has not had sex.  I advised that pt that maybe at the time of testing it was to soon for the Gonorrhea to show up.  I explained to the pt that she will get a call from the front office to schedule an appt for STD tx and that I will send in tx for Trich.  Message sent to front office to schedule asap.

## 2019-02-21 NOTE — Progress Notes (Signed)
Patient seen and assessed by nursing staff during this encounter. I have reviewed the chart and agree with the documentation and plan.  Takumi Din, MD 02/21/2019 2:29 PM    

## 2019-02-21 NOTE — Telephone Encounter (Signed)
8/26//20 Addendum: STD form for Health department completed and faxed.  Linda,RN

## 2019-02-22 ENCOUNTER — Telehealth: Payer: Self-pay | Admitting: Family Medicine

## 2019-02-22 NOTE — Telephone Encounter (Signed)
Spoke to patient about her appointment on 8/28 @ 8:50. Patient instructed to wear a face mask for the entire appointment and no visitors are allowed with her during the visit. Patient screened for covid symptoms and denied having any

## 2019-02-23 ENCOUNTER — Encounter: Payer: Self-pay | Admitting: Obstetrics & Gynecology

## 2019-02-23 ENCOUNTER — Ambulatory Visit (INDEPENDENT_AMBULATORY_CARE_PROVIDER_SITE_OTHER): Payer: Self-pay

## 2019-02-23 ENCOUNTER — Other Ambulatory Visit: Payer: Self-pay

## 2019-02-23 DIAGNOSIS — R109 Unspecified abdominal pain: Secondary | ICD-10-CM

## 2019-02-23 DIAGNOSIS — A599 Trichomoniasis, unspecified: Secondary | ICD-10-CM

## 2019-02-23 DIAGNOSIS — A749 Chlamydial infection, unspecified: Secondary | ICD-10-CM

## 2019-02-23 DIAGNOSIS — A549 Gonococcal infection, unspecified: Secondary | ICD-10-CM

## 2019-02-23 MED ORDER — METRONIDAZOLE 500 MG PO TABS: 500.0000 mg | ORAL_TABLET | Freq: Two times a day (BID) | ORAL | 0 refills | Status: DC

## 2019-02-23 MED ORDER — ACETAMINOPHEN 500 MG PO TABS
500.0000 mg | ORAL_TABLET | Freq: Once | ORAL | Status: AC
  Administered 2019-02-23: 11:00:00 500 mg via ORAL

## 2019-02-23 MED ORDER — LIDOCAINE HCL (PF) 1 % IJ SOLN
1.0000 mL | Freq: Once | INTRAMUSCULAR | Status: AC
  Administered 2019-02-23: 11:00:00 1 mL via INTRADERMAL

## 2019-02-23 MED ORDER — AZITHROMYCIN 250 MG PO TABS
1000.0000 mg | ORAL_TABLET | Freq: Once | ORAL | Status: AC
  Administered 2019-02-23: 11:00:00 1000 mg via ORAL

## 2019-02-23 MED ORDER — CEFTRIAXONE SODIUM 250 MG IJ SOLR
250.0000 mg | Freq: Once | INTRAMUSCULAR | Status: AC
  Administered 2019-02-23: 250 mg via INTRAMUSCULAR

## 2019-02-23 NOTE — Progress Notes (Signed)
Patient seen and assessed by nursing staff during this encounter. I have reviewed the chart and agree with the documentation and plan.  Fatima Blank, CNM 02/23/2019 11:02 AM

## 2019-02-23 NOTE — Progress Notes (Signed)
Pt here for STD tx of Chlamydia, Gonorrhea & Tric, Sent Flagyl to Pharmacy on file & administered Rocephin & Zithromax. Also gave 500 MG Tylenol for Cramps. Advised pt will come back for TOC in 4 weeks. Pt verbalized understanding.

## 2019-02-23 NOTE — Addendum Note (Signed)
Addended by: Bethanne Ginger on: 02/23/2019 10:39 AM   Modules accepted: Orders

## 2019-03-06 ENCOUNTER — Encounter: Payer: Self-pay | Admitting: *Deleted

## 2019-03-14 ENCOUNTER — Telehealth: Payer: Self-pay | Admitting: Obstetrics and Gynecology

## 2019-03-22 ENCOUNTER — Telehealth: Payer: Self-pay | Admitting: Family Medicine

## 2019-03-22 NOTE — Telephone Encounter (Signed)
Spoke to patient about her appointment on 9/25 @ 9:00. Patient instructed to wear a face mask for the entire appointment and no visitors are allowed with her during the visit. Patient screened for covid symptoms and denied having any

## 2019-03-23 ENCOUNTER — Ambulatory Visit: Payer: Self-pay

## 2019-03-29 ENCOUNTER — Ambulatory Visit: Payer: Self-pay

## 2019-04-04 ENCOUNTER — Emergency Department (HOSPITAL_BASED_OUTPATIENT_CLINIC_OR_DEPARTMENT_OTHER): Payer: Self-pay

## 2019-04-04 ENCOUNTER — Other Ambulatory Visit: Payer: Self-pay

## 2019-04-04 ENCOUNTER — Emergency Department (HOSPITAL_BASED_OUTPATIENT_CLINIC_OR_DEPARTMENT_OTHER)
Admission: EM | Admit: 2019-04-04 | Discharge: 2019-04-04 | Disposition: A | Discharge status: Home / Self Care | Payer: Self-pay | Attending: Emergency Medicine | Admitting: Emergency Medicine

## 2019-04-04 ENCOUNTER — Encounter (HOSPITAL_BASED_OUTPATIENT_CLINIC_OR_DEPARTMENT_OTHER): Payer: Self-pay

## 2019-04-04 DIAGNOSIS — B9689 Other specified bacterial agents as the cause of diseases classified elsewhere: Secondary | ICD-10-CM

## 2019-04-04 DIAGNOSIS — Z87891 Personal history of nicotine dependence: Secondary | ICD-10-CM | POA: Insufficient documentation

## 2019-04-04 DIAGNOSIS — N760 Acute vaginitis: Secondary | ICD-10-CM | POA: Insufficient documentation

## 2019-04-04 DIAGNOSIS — R102 Pelvic and perineal pain: Secondary | ICD-10-CM

## 2019-04-04 DIAGNOSIS — Z79899 Other long term (current) drug therapy: Secondary | ICD-10-CM | POA: Insufficient documentation

## 2019-04-04 DIAGNOSIS — A599 Trichomoniasis, unspecified: Secondary | ICD-10-CM

## 2019-04-04 DIAGNOSIS — Z3202 Encounter for pregnancy test, result negative: Secondary | ICD-10-CM | POA: Insufficient documentation

## 2019-04-04 LAB — URINALYSIS, ROUTINE W REFLEX MICROSCOPIC
Bilirubin Urine: NEGATIVE
Glucose, UA: NEGATIVE mg/dL
Hgb urine dipstick: NEGATIVE
Ketones, ur: NEGATIVE mg/dL
Leukocytes,Ua: NEGATIVE
Nitrite: NEGATIVE
Protein, ur: NEGATIVE mg/dL
Specific Gravity, Urine: 1.02 (ref 1.005–1.030)
pH: 6 (ref 5.0–8.0)

## 2019-04-04 LAB — BASIC METABOLIC PANEL
Anion gap: 10 (ref 5–15)
BUN: 12 mg/dL (ref 6–20)
CO2: 20 mmol/L — ABNORMAL LOW (ref 22–32)
Calcium: 8.4 mg/dL — ABNORMAL LOW (ref 8.9–10.3)
Chloride: 109 mmol/L (ref 98–111)
Creatinine, Ser: 0.72 mg/dL (ref 0.44–1.00)
GFR calc Af Amer: >60 mL/min (ref >60)
GFR calc non Af Amer: >60 mL/min (ref >60)
Glucose, Bld: 82 mg/dL (ref 70–99)
Potassium: 3.7 mmol/L (ref 3.5–5.1)
Sodium: 139 mmol/L (ref 135–145)

## 2019-04-04 LAB — CBC WITH DIFFERENTIAL/PLATELET
Abs Immature Granulocytes: 0.01 10*3/uL (ref 0.00–0.07)
Basophils Absolute: 0.1 10*3/uL (ref 0.0–0.1)
Basophils Relative: 1 %
Eosinophils Absolute: 0.2 10*3/uL (ref 0.0–0.5)
Eosinophils Relative: 3 %
HCT: 39.3 % (ref 36.0–46.0)
Hemoglobin: 13.8 g/dL (ref 12.0–15.0)
Immature Granulocytes: 0 %
Lymphocytes Relative: 56 %
Lymphs Abs: 3.6 10*3/uL (ref 0.7–4.0)
MCH: 32.6 pg (ref 26.0–34.0)
MCHC: 35.1 g/dL (ref 30.0–36.0)
MCV: 92.9 fL (ref 80.0–100.0)
Monocytes Absolute: 0.5 10*3/uL (ref 0.1–1.0)
Monocytes Relative: 8 %
Neutro Abs: 2.1 10*3/uL (ref 1.7–7.7)
Neutrophils Relative %: 32 %
Platelets: 290 10*3/uL (ref 150–400)
RBC: 4.23 MIL/uL (ref 3.87–5.11)
RDW: 12.9 % (ref 11.5–15.5)
WBC: 6.4 10*3/uL (ref 4.0–10.5)
nRBC: 0 % (ref 0.0–0.2)

## 2019-04-04 LAB — WET PREP, GENITAL
Trich, Wet Prep: NONE SEEN
Yeast Wet Prep HPF POC: NONE SEEN

## 2019-04-04 LAB — HIV ANTIBODY (ROUTINE TESTING W REFLEX): HIV Screen 4th Generation wRfx: NONREACTIVE

## 2019-04-04 LAB — PREGNANCY, URINE: Preg Test, Ur: NEGATIVE

## 2019-04-04 MED ORDER — SODIUM CHLORIDE 0.9 % IV BOLUS
1000.0000 mL | Freq: Once | INTRAVENOUS | Status: AC
  Administered 2019-04-04: 1000 mL via INTRAVENOUS

## 2019-04-04 MED ORDER — LIDOCAINE HCL (PF) 1 % IJ SOLN
INTRAMUSCULAR | Status: AC
  Filled 2019-04-04: qty 5

## 2019-04-04 MED ORDER — ONDANSETRON HCL 4 MG/2ML IJ SOLN
4.0000 mg | Freq: Once | INTRAMUSCULAR | Status: AC
  Administered 2019-04-04: 13:00:00 4 mg via INTRAVENOUS
  Filled 2019-04-04: qty 2

## 2019-04-04 MED ORDER — DOXYCYCLINE HYCLATE 100 MG PO TABS
100.0000 mg | ORAL_TABLET | Freq: Once | ORAL | Status: AC
  Administered 2019-04-04: 15:00:00 100 mg via ORAL
  Filled 2019-04-04: qty 1

## 2019-04-04 MED ORDER — CEFTRIAXONE SODIUM 250 MG IJ SOLR
250.0000 mg | Freq: Once | INTRAMUSCULAR | Status: AC
  Administered 2019-04-04: 15:00:00 250 mg via INTRAMUSCULAR
  Filled 2019-04-04: qty 250

## 2019-04-04 MED ORDER — DOXYCYCLINE HYCLATE 100 MG PO CAPS: 100.0000 mg | ORAL_CAPSULE | Freq: Two times a day (BID) | ORAL | 0 refills | Status: DC

## 2019-04-04 MED ORDER — MORPHINE SULFATE (PF) 4 MG/ML IV SOLN
4.0000 mg | Freq: Once | INTRAVENOUS | Status: AC
  Administered 2019-04-04: 15:00:00 4 mg via INTRAVENOUS
  Filled 2019-04-04: qty 1

## 2019-04-04 MED ORDER — METRONIDAZOLE 500 MG PO TABS: 500.0000 mg | ORAL_TABLET | Freq: Two times a day (BID) | ORAL | 0 refills | Status: DC

## 2019-04-04 MED ORDER — MORPHINE SULFATE (PF) 4 MG/ML IV SOLN
4.0000 mg | Freq: Once | INTRAVENOUS | Status: AC
  Administered 2019-04-04: 4 mg via INTRAVENOUS
  Filled 2019-04-04: qty 1

## 2019-04-04 NOTE — ED Provider Notes (Signed)
[facility] EMERGENCY DEPARTMENT Provider Note   CSN: [id number] Arrival date & time: 04/04/19  1211     History   Chief Complaint Chief Complaint  Patient presents with  . Abdominal Pain    HPI Rose Hunt is a 33 y.o. female.  She has a history of tubal ligation and subsequent to that that had a ectopic pregnancy.  She is complaining of being late for a few days and complaining of crampy low abdominal pain that feels like her cycle.  No vaginal bleeding or discharge.  Is associated with some nausea and vomiting.  Been going on for 3 days.  She is also having some tingling in her left arm that she noted when she woke up this morning.  She has had a soft tissue mass on the dorsum of her left index finger for a while and she says it is more tender now.  No fevers or chills no chest pain or shortness of breath.  No other numbness or weakness.     The history is provided by the patient.  Abdominal Pain Pain location:  Suprapubic Pain quality: cramping   Pain radiates to:  Does not radiate Pain severity:  Moderate Onset quality:  Gradual Timing:  Constant Progression:  Worsening Chronicity:  New Context: previous surgery   Context: not recent travel, not sick contacts and not trauma   Relieved by:  Nothing Worsened by:  Nothing Ineffective treatments:  NSAIDs Associated symptoms: nausea and vomiting   Associated symptoms: no chest pain, no constipation, no cough, no diarrhea, no dysuria, no fever, no shortness of breath, no sore throat, no vaginal bleeding and no vaginal discharge     Past Medical History:  Diagnosis Date  . Chlamydia infection 01/11/2019  . Medical history non-contributory     Patient Active Problem List   Diagnosis Date Noted  . Chlamydia infection 01/11/2019  . BV (bacterial vaginosis) 01/10/2019  . Chronic PID (chronic pelvic inflammatory disease) 01/10/2019  . Ruptured right tubal ectopic pregnancy causing hemoperitoneum 01/09/2019  .  History of bilateral tubal ligation 01/09/2019  . Trichomonal vaginitis 01/09/2019    Past Surgical History:  Procedure Laterality Date  . ECTOPIC PREGNANCY SURGERY    . IUD REMOVAL    . LAPAROSCOPIC SALPINGO OOPHERECTOMY Right 01/09/2019   Procedure: LAPAROSCOPIC SALPINGO OOPHORECTOMY WITH REMOVAL OF ECTOPIC PREGNANCY;  Surgeon: Tereso NewcomerAnyanwu, Ugonna A, MD;  Location: [facility] OR;  Service: Gynecology;  Laterality: Right;  . LAPAROSCOPIC UNILATERAL SALPINGECTOMY Left 01/09/2019   Procedure: Laparoscopic Left Salpingectomy;  Surgeon: Tereso NewcomerAnyanwu, Ugonna A, MD;  Location: [facility] OR;  Service: Gynecology;  Laterality: Left;  . TUBAL LIGATION  2007     OB History    Gravida  4   Para  2   Term  2   Preterm      AB  1   Living  2     SAB      TAB  1   Ectopic      Multiple      Live Births               Home Medications    Prior to Admission medications   Medication Sig Start Date End Date Taking? Authorizing Provider  ibuprofen (ADVIL) 600 MG tablet Take 1 tablet (600 mg total) by mouth every 6 (six) hours as needed for headache, mild pain, moderate pain or cramping. 01/10/19   Anyanwu, Jethro BastosUgonna A, MD  metroNIDAZOLE (FLAGYL) 500 MG tablet Take 1 tablet (  500 mg total) by mouth 2 (two) times daily. 02/23/19   Leftwich-Kirby, Wilmer Floor, CNM    Family History Family History  Problem Relation Age of Onset  . Diabetes Mother   . Hypertension Mother     Social History Social History   Tobacco Use  . Smoking status: Former Games developer  . Smokeless tobacco: Never Used  Substance Use Topics  . Alcohol use: Yes    Comment: occ  . Drug use: Yes    Types: Marijuana     Allergies   Patient has no known allergies.   Review of Systems Review of Systems  Constitutional: Negative for fever.  HENT: Negative for sore throat.   Eyes: Negative for visual disturbance.  Respiratory: Negative for cough and shortness of breath.   Cardiovascular: Negative for chest pain.  Gastrointestinal:  Positive for abdominal pain, nausea and vomiting. Negative for constipation and diarrhea.  Genitourinary: Negative for dysuria, vaginal bleeding and vaginal discharge.  Musculoskeletal: Negative for neck pain.  Skin: Negative for rash.  Neurological: Positive for numbness. Negative for speech difficulty and headaches.     Physical Exam Updated Vital Signs BP (!) 142/98 (BP Location: Left Arm)   Pulse 86   Temp 98.2 F (36.8 C) (Oral)   Resp 18   Ht 5\' 5"  (1.651 m)   Wt 98.9 kg   LMP 03/03/2019   SpO2 98%   BMI 36.28 kg/m   Physical Exam Vitals signs and nursing note reviewed. Exam conducted with a chaperone present.  Constitutional:      General: She is not in acute distress.    Appearance: She is well-developed.  HENT:     Head: Normocephalic and atraumatic.  Eyes:     Conjunctiva/sclera: Conjunctivae normal.  Neck:     Musculoskeletal: Neck supple.  Cardiovascular:     Rate and Rhythm: Normal rate and regular rhythm.     Heart sounds: No murmur.  Pulmonary:     Effort: Pulmonary effort is normal. No respiratory distress.     Breath sounds: Normal breath sounds. No stridor. No wheezing.  Abdominal:     Palpations: Abdomen is soft.     Tenderness: There is abdominal tenderness in the suprapubic area. There is no guarding or rebound.  Genitourinary:    Vagina: Vaginal discharge (minimal white) present. No tenderness or bleeding.     Cervix: Cervical motion tenderness present.     Uterus: Tender.      Adnexa: Right adnexa normal.       Left: Tenderness present. No mass.    Musculoskeletal: Normal range of motion.        General: No tenderness.     Comments: She has approximately 1 cm soft tissue mass on the dorsum of her left index finger over her proximal phalanx that is mobile.  Likely ganglion.  Skin:    General: Skin is warm and dry.     Capillary Refill: Capillary refill takes less than 2 seconds.  Neurological:     General: No focal deficit present.      Mental Status: She is alert.     GCS: GCS eye subscore is 4. GCS verbal subscore is 5. GCS motor subscore is 6.     Sensory: No sensory deficit (tinging left hand, intact light touch sensation).     Motor: No weakness.      ED Treatments / Results  Labs (all labs ordered are listed, but only abnormal results are displayed) Labs Reviewed  WET PREP,  GENITAL - Abnormal; Notable for the following components:      Result Value   Clue Cells Wet Prep HPF POC PRESENT (*)    WBC, Wet Prep HPF POC MANY (*)    All other components within normal limits  BASIC METABOLIC PANEL - Abnormal; Notable for the following components:   CO2 20 (*)    Calcium 8.4 (*)    All other components within normal limits  URINALYSIS, ROUTINE W REFLEX MICROSCOPIC  PREGNANCY, URINE  CBC WITH DIFFERENTIAL/PLATELET  RPR  HIV ANTIBODY (ROUTINE TESTING W REFLEX)  HIV4GL SAVE TUBE  GC/CHLAMYDIA PROBE AMP ([place]) NOT AT [facility]    EKG None  Radiology No results found.  Procedures Procedures (including critical care time)  Medications Ordered in ED Medications  lidocaine (PF) (XYLOCAINE) 1 % injection (has no administration in time range)  morphine 4 MG/ML injection 4 mg (4 mg Intravenous Given 04/04/19 1317)  sodium chloride 0.9 % bolus 1,000 mL ( Intravenous Stopped 04/04/19 1349)  ondansetron (ZOFRAN) injection 4 mg (4 mg Intravenous Given 04/04/19 1317)  morphine 4 MG/ML injection 4 mg (4 mg Intravenous Given 04/04/19 1444)  cefTRIAXone (ROCEPHIN) injection 250 mg (250 mg Intramuscular Given 04/04/19 1445)  doxycycline (VIBRA-TABS) tablet 100 mg (100 mg Oral Given 04/04/19 1451)     Initial Impression / Assessment and Plan / ED Course  I have reviewed the triage vital signs and the nursing notes.  Pertinent labs & imaging results that were available during my care of the patient were reviewed by me and considered in my medical decision making (see chart for details).  Clinical Course as of Apr 03 1636   Wed Apr 04, 7043  6275 33 year old female with prior history of ectopic here with low crampy abdominal pain and being late on her period although it sounds like just for a few days.  She is got some diffuse low tenderness.  Getting pregnancy test and labs will need a pelvic exam with STD testing.  Also complaining of some left arm pins-and-needles although primarily is in her hand and possibly related to a ganglion that she has on her left index finger although not sure why.  She is neuro intact.  Differential includes ectopic pregnancy, PID, endometriosis, pelvic congestion syndrome.   [MB]  8676 Patient's pregnancy test back and negative.  Have put her in for a pelvic ultrasound to rule out torsion.  Labs coming back with a normal white count wet prep showing clue cells.   [MB]    Clinical Course User Index [MB] Hayden Rasmussen, MD      Signed out to Dr Gustavus Messing to followup on u/s and if no significant findings dispo with abx written for.   Final Clinical Impressions(s) / ED Diagnoses   Final diagnoses:  Pelvic pain in female  BV (bacterial vaginosis)    ED Discharge Orders         Ordered    metroNIDAZOLE (FLAGYL) 500 MG tablet  2 times daily     04/04/19 1424    doxycycline (VIBRAMYCIN) 100 MG capsule  2 times daily     04/04/19 1424           Hayden Rasmussen, MD 04/04/19 [phone]

## 2019-04-04 NOTE — ED Triage Notes (Addendum)
Pt c/o abd pain n/v x 3 days-entered triage bent over walking-states she feels the same as when she had ectopic pregnancy-pt also c/o tingling to left arm that she woke with this am

## 2019-04-04 NOTE — Discharge Instructions (Addendum)
You were seen in the emergency department for pelvic pain.  You had blood work urinalysis and an ultrasound.  Your STD testing is pending at time of discharge.  You were treated for possible STDs.  We will contact you if any of your tests were positive.  Please finish your antibiotics and schedule a follow-up appointment with the gynecology clinic.  Return to the emergency department if any worsening symptoms.  Please consider following with a hand surgery team for your bump on your finger.

## 2019-04-04 NOTE — ED Provider Notes (Signed)
Care assumed from Dr. Melina Copa.  At time of transfer care, patient is awaiting results of pelvic ultrasound to look for TOA, torsion, or other concerning etiology.  Patient will likely discharge with antibiotics for presumptive PID and BV once torsion is ruled out.   Anticipate reassessment after ultrasound is completed.  5:30 PM Ultrasound showed no evidence of torsion or TOA.  Patient has possible fibroid and MRI as an outpatient recommended.  Patient was informed of this.  Patient will take antibiotics for the BV and presumptive pelvic infection.  Patient will follow-up with her OB/GYN for further management of the possible fibroid.  Patient also complained of a bump on her finger that has been there for months.  It is mobile and does not appear infected.  Doubt cellulitis however patient will be on doxycycline which would cover for any cellulitis.  Given the large bump, patient will be given instructions to follow-up with hand surgery for further management.  Patient received return precautions and follow-up instructions.  Patient discharged in good condition.  Clinical Impression: 1. Pelvic pain in female   2. Pelvic pain   3. BV (bacterial vaginosis)   4. Trichomonas infection     Disposition: Discharge  Condition: Good  I have discussed the results, Dx and Tx plan with the pt(& family if present). He/she/they expressed understanding and agree(s) with the plan. Discharge instructions discussed at great length. Strict return precautions discussed and pt &/or family have verbalized understanding of the instructions. No further questions at time of discharge.    New Prescriptions   DOXYCYCLINE (VIBRAMYCIN) 100 MG CAPSULE    Take 1 capsule (100 mg total) by mouth 2 (two) times daily.    Follow Up: [facility] [address], [place] [id number] [place] [address] [phone] Schedule an appointment as soon as  possible for a visit  For recheck of your symptoms  [address], [address] [place]  [phone]   for your hand follow up.     Truda Staub, Gwenyth Allegra, MD 04/04/19 [phone]

## 2019-04-05 ENCOUNTER — Ambulatory Visit: Payer: Self-pay

## 2019-04-05 LAB — RPR: RPR Ser Ql: NONREACTIVE

## 2019-04-06 LAB — GC/CHLAMYDIA PROBE AMP (~~LOC~~) NOT AT ARMC
Chlamydia: NEGATIVE
Neisseria Gonorrhea: NEGATIVE

## 2019-05-11 ENCOUNTER — Encounter: Payer: Self-pay | Admitting: Obstetrics & Gynecology

## 2019-05-11 ENCOUNTER — Ambulatory Visit (INDEPENDENT_AMBULATORY_CARE_PROVIDER_SITE_OTHER): Payer: Self-pay | Admitting: Obstetrics & Gynecology

## 2019-05-11 ENCOUNTER — Other Ambulatory Visit: Payer: Self-pay

## 2019-05-11 VITALS — BP 130/89 | HR 83 | Ht 64.0 in | Wt 223.0 lb

## 2019-05-11 DIAGNOSIS — Z113 Encounter for screening for infections with a predominantly sexual mode of transmission: Secondary | ICD-10-CM

## 2019-05-11 DIAGNOSIS — N898 Other specified noninflammatory disorders of vagina: Secondary | ICD-10-CM

## 2019-05-11 DIAGNOSIS — B9689 Other specified bacterial agents as the cause of diseases classified elsewhere: Secondary | ICD-10-CM

## 2019-05-11 DIAGNOSIS — Z0000 Encounter for general adult medical examination without abnormal findings: Secondary | ICD-10-CM

## 2019-05-11 DIAGNOSIS — B3731 Acute candidiasis of vulva and vagina: Secondary | ICD-10-CM

## 2019-05-11 DIAGNOSIS — R102 Pelvic and perineal pain: Secondary | ICD-10-CM

## 2019-05-11 DIAGNOSIS — N760 Acute vaginitis: Secondary | ICD-10-CM

## 2019-05-11 DIAGNOSIS — B373 Candidiasis of vulva and vagina: Secondary | ICD-10-CM

## 2019-05-11 NOTE — Progress Notes (Signed)
   GYNECOLOGY OFFICE VISIT NOTE  History:   Rose Hunt is a 33 y.o. [phone] here today for follow up after recent ED visit last month. Seen for pelvic pain, diagnosed with BV, and told to follow up here. No symptoms today, but desires evaluation to make sure BV is gone.  Also has left hand ganglion cyst, wants evaluation. She denies any abnormal vaginal discharge, bleeding, pelvic pain or other concerns.    Past Medical History:  Diagnosis Date  . Chlamydia infection 01/11/2019  . Medical history non-contributory     Past Surgical History:  Procedure Laterality Date  . ECTOPIC PREGNANCY SURGERY    . IUD REMOVAL    . LAPAROSCOPIC SALPINGO OOPHERECTOMY Right 01/09/2019   Procedure: LAPAROSCOPIC SALPINGO OOPHORECTOMY WITH REMOVAL OF ECTOPIC PREGNANCY;  Surgeon: Osborne Oman, MD;  Location: [facility];  Service: Gynecology;  Laterality: Right;  . LAPAROSCOPIC UNILATERAL SALPINGECTOMY Left 01/09/2019   Procedure: Laparoscopic Left Salpingectomy;  Surgeon: Osborne Oman, MD;  Location: [facility];  Service: Gynecology;  Laterality: Left;  . TUBAL LIGATION  2007    The following portions of the patient's history were reviewed and updated as appropriate: allergies, current medications, past family history, past medical history, past social history, past surgical history and problem list.   Health Maintenance:  Normal pap many years ago.  Review of Systems:  Pertinent items noted in HPI and remainder of comprehensive ROS otherwise negative.  Physical Exam:  BP 130/89   Pulse 83   Ht 5\' 4"  (1.626 m)   Wt 223 lb (101.2 kg)   LMP 05/04/2019 (Exact Date) Comment: tubal ligation 13 years ago  BMI 38.28 kg/m  CONSTITUTIONAL: Well-developed, well-nourished female in no acute distress.  CARDIOVASCULAR: Normal heart rate noted RESPIRATORY: Effort and breath sounds normal, no problems with respiration noted ABDOMEN: No masses noted. No other overt distention noted.   PELVIC: Deferred      Assessment and Plan:    1. Pelvic pain in female No symptoms.  2. BV (bacterial vaginosis) Will recheck today, and follow up accordingly. - Cervicovaginal ancillary only( [place])  3. Preventative health care Referred to [place] for evaluation of ganglion cyst and to establish primary care.  - Ambulatory referral to Internal Medicine  Routine preventative health maintenance measures emphasized. Please refer to After Visit Summary for other counseling recommendations.   Return for any gynecologic concerns.    Total face-to-face time with patient: 10 minutes.  Over 50% of encounter was spent on counseling and coordination of care.   [name], MD, [place] for [employer], [place]

## 2019-05-11 NOTE — Patient Instructions (Addendum)
Return to clinic for any scheduled appointments or for any gynecologic concerns as needed.   [facility]

## 2019-05-15 LAB — CERVICOVAGINAL ANCILLARY ONLY
Bacterial Vaginitis (gardnerella): NEGATIVE
Candida Glabrata: NEGATIVE
Candida Vaginitis: POSITIVE — AB
Chlamydia: NEGATIVE
Comment: NEGATIVE
Comment: NEGATIVE
Comment: NEGATIVE
Comment: NEGATIVE
Comment: NEGATIVE
Comment: NORMAL
Neisseria Gonorrhea: NEGATIVE
Trichomonas: NEGATIVE

## 2019-05-16 MED ORDER — FLUCONAZOLE 150 MG PO TABS: 150.0000 mg | ORAL_TABLET | Freq: Once | ORAL | 3 refills | Status: AC

## 2019-05-16 NOTE — Addendum Note (Signed)
Addended by: [name] A on: 05/16/2019 02:07 PM   Modules accepted: Orders

## 2019-06-04 ENCOUNTER — Ambulatory Visit: Payer: Self-pay

## 2019-06-19 ENCOUNTER — Encounter: Payer: Self-pay | Admitting: *Deleted

## 2019-07-23 ENCOUNTER — Encounter (HOSPITAL_COMMUNITY): Payer: Self-pay

## 2019-07-23 ENCOUNTER — Other Ambulatory Visit: Payer: Self-pay

## 2019-07-23 ENCOUNTER — Other Ambulatory Visit: Payer: Self-pay | Admitting: *Deleted

## 2019-07-23 DIAGNOSIS — Z124 Encounter for screening for malignant neoplasm of cervix: Secondary | ICD-10-CM

## 2019-07-23 NOTE — Progress Notes (Signed)
Patient: Rose Hunt           Date of Birth: [birth year]           MRN: [id number] Visit Date: 07/23/2019 PCP: Patient, No Pcp Per Temp: 98.4 Temporal    Cervical Exam Normal Exam. Patient stated she has a history of an abnormal Pap smear around 10 years ago that a colposcopy was completed for follow-up. Per patient has had at least three normal Pap smears since colposcopy. If today's Pap smear is normal next Pap smear will be due in three years due to patient has had at least three normal Pap smears since colposcopy per ASCCP guidelines.  Patient's History Patient Active Problem List   Diagnosis Date Noted  . Chlamydia infection 01/11/2019  . BV (bacterial vaginosis) 01/10/2019  . Chronic PID (chronic pelvic inflammatory disease) 01/10/2019  . Ruptured right tubal ectopic pregnancy causing hemoperitoneum 01/09/2019  . History of bilateral tubal ligation 01/09/2019  . Trichomonal vaginitis 01/09/2019   Past Medical History:  Diagnosis Date  . Chlamydia infection 01/11/2019    Family History  Problem Relation Age of Onset  . Diabetes Mother   . Hypertension Mother     Social History   Occupational History  . Not on file  Tobacco Use  . Smoking status: Former Games developer  . Smokeless tobacco: Never Used  Substance and Sexual Activity  . Alcohol use: Yes    Comment: occ  . Drug use: Yes    Types: Marijuana  . Sexual activity: Yes    Birth control/protection: Surgical

## 2019-07-24 LAB — CYTOLOGY - PAP: Diagnosis: NEGATIVE

## 2019-07-30 ENCOUNTER — Telehealth (HOSPITAL_COMMUNITY): Payer: Self-pay | Admitting: *Deleted

## 2019-07-30 NOTE — Telephone Encounter (Signed)
Normal Pap smear result letter mailed to patient 07/30/2019. 

## 2020-03-04 ENCOUNTER — Other Ambulatory Visit: Payer: Self-pay

## 2020-03-04 ENCOUNTER — Emergency Department (HOSPITAL_BASED_OUTPATIENT_CLINIC_OR_DEPARTMENT_OTHER)
Admission: EM | Admit: 2020-03-04 | Discharge: 2020-03-04 | Disposition: A | Discharge status: Home / Self Care | Payer: No Typology Code available for payment source | Attending: Emergency Medicine | Admitting: Emergency Medicine

## 2020-03-04 ENCOUNTER — Encounter (HOSPITAL_BASED_OUTPATIENT_CLINIC_OR_DEPARTMENT_OTHER): Payer: Self-pay | Admitting: *Deleted

## 2020-03-04 DIAGNOSIS — M7918 Myalgia, other site: Secondary | ICD-10-CM | POA: Insufficient documentation

## 2020-03-04 DIAGNOSIS — Z79899 Other long term (current) drug therapy: Secondary | ICD-10-CM | POA: Insufficient documentation

## 2020-03-04 DIAGNOSIS — Z20822 Contact with and (suspected) exposure to covid-19: Secondary | ICD-10-CM

## 2020-03-04 DIAGNOSIS — Z87891 Personal history of nicotine dependence: Secondary | ICD-10-CM | POA: Diagnosis not present

## 2020-03-04 DIAGNOSIS — M791 Myalgia, unspecified site: Secondary | ICD-10-CM

## 2020-03-04 LAB — SARS CORONAVIRUS 2 BY RT PCR (HOSPITAL ORDER, PERFORMED IN ~~LOC~~ HOSPITAL LAB): SARS Coronavirus 2: NEGATIVE

## 2020-03-04 MED ORDER — NAPROXEN 500 MG PO TABS: 500.0000 mg | ORAL_TABLET | Freq: Two times a day (BID) | ORAL | 0 refills | Status: DC

## 2020-03-04 MED ORDER — LIDOCAINE 5 % EX PTCH: 1.0000 | MEDICATED_PATCH | CUTANEOUS | 0 refills | Status: AC

## 2020-03-04 MED FILL — NAPROXEN 500 MG TABS: 500 | 15 days supply | Qty: 30 | Fill #0

## 2020-03-04 NOTE — Discharge Instructions (Signed)
May use antiinflammatories if not pregnant.   Retest in 3 days if continued symptoms

## 2020-03-04 NOTE — ED Triage Notes (Signed)
Covid contact 3 days ago. Body aches.

## 2020-03-04 NOTE — ED Provider Notes (Signed)
[facility] EMERGENCY DEPARTMENT Provider Note   CSN: [id number] Arrival date & time: 03/04/20  1050    History Myalgias   Marvis Bakken is a 34 y.o. female with past medical history significant for PID who presents for evaluation of myalgias. Exposed to COVID 3 days ago. Symptoms started this am. No fever, chills, nausea, vomiting, diarrhea, abd pain, cough, sore throat, congestion, rhinorrhea, CP, SOB. No unilateral leg swelling, redness warmth. Has not taken anything for symptoms. Denies additional aggravating or alleviating factors.  No recent injury or trauma.  Has not been working outside in the heat.  No bony tenderness or falls.  History obtained from patient and past medical records. No interpretor was used.  HPI     Past Medical History:  Diagnosis Date  . Chlamydia infection 01/11/2019    Patient Active Problem List   Diagnosis Date Noted  . Chlamydia infection 01/11/2019  . BV (bacterial vaginosis) 01/10/2019  . Chronic PID (chronic pelvic inflammatory disease) 01/10/2019  . Ruptured right tubal ectopic pregnancy causing hemoperitoneum 01/09/2019  . History of bilateral tubal ligation 01/09/2019  . Trichomonal vaginitis 01/09/2019    Past Surgical History:  Procedure Laterality Date  . ECTOPIC PREGNANCY SURGERY    . IUD REMOVAL    . LAPAROSCOPIC SALPINGO OOPHERECTOMY Right 01/09/2019   Procedure: LAPAROSCOPIC SALPINGO OOPHORECTOMY WITH REMOVAL OF ECTOPIC PREGNANCY;  Surgeon: Tereso Newcomer, MD;  Location: [facility];  Service: Gynecology;  Laterality: Right;  . LAPAROSCOPIC UNILATERAL SALPINGECTOMY Left 01/09/2019   Procedure: Laparoscopic Left Salpingectomy;  Surgeon: Tereso Newcomer, MD;  Location: [facility];  Service: Gynecology;  Laterality: Left;  . TUBAL LIGATION  2007     OB History    Gravida  4   Para  2   Term  2   Preterm      AB  1   Living  2     SAB      TAB  1   Ectopic      Multiple      Live Births               Family History  Problem Relation Age of Onset  . Diabetes Mother   . Hypertension Mother     Social History   Tobacco Use  . Smoking status: Former Games developer  . Smokeless tobacco: Never Used  Vaping Use  . Vaping Use: Never used  Substance Use Topics  . Alcohol use: Yes    Comment: occ  . Drug use: Yes    Types: Marijuana    Home Medications Prior to Admission medications   Medication Sig Start Date End Date Taking? Authorizing Provider  doxycycline (VIBRAMYCIN) 100 MG capsule Take 1 capsule (100 mg total) by mouth 2 (two) times daily. Patient not taking: Reported on 05/11/2019 04/04/19   Terrilee Files, MD  ibuprofen (ADVIL) 600 MG tablet Take 1 tablet (600 mg total) by mouth every 6 (six) hours as needed for headache, mild pain, moderate pain or cramping. Patient not taking: Reported on 05/11/2019 01/10/19   Anyanwu, Jethro Bastos, MD  lidocaine (LIDODERM) 5 % Place 1 patch onto the skin daily. Remove & Discard patch within 12 hours or as directed by MD 03/04/20   Constanza Mincy A, PA-C  naproxen (NAPROSYN) 500 MG tablet Take 1 tablet (500 mg total) by mouth 2 (two) times daily. 03/04/20   Baylie Drakes A, PA-C    Allergies    Patient has no known allergies.  Review of Systems   Review of Systems  Constitutional: Negative.  Negative for activity change.  HENT: Negative.   Respiratory: Negative.   Cardiovascular: Negative.   Gastrointestinal: Negative.   Genitourinary: Negative.   Musculoskeletal: Positive for myalgias. Negative for arthralgias, back pain, gait problem, joint swelling, neck pain and neck stiffness.  Skin: Negative.   Neurological: Negative.   All other systems reviewed and are negative.   Physical Exam Updated Vital Signs BP (!) 137/105 (BP Location: Right Arm)   Pulse 70   Temp 98.8 F (37.1 C) (Oral)   Resp 18   Ht 5\' 4"  (1.626 m)   Wt 99.8 kg   LMP 02/05/2020   SpO2 100%   BMI 37.76 kg/m   Physical Exam Vitals and nursing note  reviewed.  Constitutional:      General: She is not in acute distress.    Appearance: She is not ill-appearing, toxic-appearing or diaphoretic.  HENT:     Head: Normocephalic and atraumatic.     Jaw: There is normal jaw occlusion.     Right Ear: Tympanic membrane, ear canal and external ear normal. There is no impacted cerumen. No hemotympanum. Tympanic membrane is not injected, scarred, perforated, erythematous, retracted or bulging.     Left Ear: Tympanic membrane, ear canal and external ear normal. There is no impacted cerumen. No hemotympanum. Tympanic membrane is not injected, scarred, perforated, erythematous, retracted or bulging.     Ears:     Comments: No Mastoid tenderness.    Nose:     Comments: Clear rhinorrhea and congestion to bilateral nares.  No sinus tenderness.    Mouth/Throat:     Comments: Posterior oropharynx clear.  Mucous membranes moist.  Tonsils without erythema or exudate.  Uvula midline without deviation.  No evidence of PTA or RPA.  No drooling, dysphasia or trismus.  Phonation normal. Neck:     Trachea: Trachea and phonation normal.     Meningeal: Brudzinski's sign and Kernig's sign absent.     Comments: No Neck stiffness or neck rigidity.  No meningismus.  No cervical lymphadenopathy. Cardiovascular:     Comments: No murmurs rubs or gallops. Pulmonary:     Comments: Clear to auscultation bilaterally without wheeze, rhonchi or rales.  No accessory muscle usage.  Able speak in full sentences. Abdominal:     Comments: Soft, nontender without rebound or guarding.  No CVA tenderness.  Musculoskeletal:     Comments: Moves all 4 extremities without difficulty.  Lower extremities without edema, erythema or warmth.  Skin:    Comments: Brisk capillary refill.  No rashes or lesions.  Neurological:     Mental Status: She is alert.     Comments: Ambulatory in department without difficulty.  Cranial nerves II through XII grossly intact.  No facial droop.  No aphasia.      ED Results / Procedures / Treatments   Labs (all labs ordered are listed, but only abnormal results are displayed) Labs Reviewed  SARS CORONAVIRUS 2 BY RT PCR (HOSPITAL ORDER, PERFORMED IN [facility])    EKG None  Radiology No results found.  Procedures Procedures (including critical care time)  Medications Ordered in ED Medications - No data to display  ED Course  I have reviewed the triage vital signs and the nursing notes.  Pertinent labs & imaging results that were available during my care of the patient were reviewed by me and considered in my medical decision making (see chart for details).  34 year old presents for myalgias. Afebrile, non septic non ill appearing. Non focal neuro exam without deficits. No sudden onset thunderclap ha. Heart and lungs clear.  Abdomen soft, nontender.  VS stable. Appears otherwise well. Abs soft. Tolerating PO intake.  COVID test negative here in ED however symptoms suspicious for viral illness, COVID not excluded. Discusses symptomatic management and possible retest given symptoms starting today with recent exposure. Will quarantine until symptoms resolved or retest negative for COVID.  Low suspicion for sepsis or acute bacterial illness.  She has no neck stiffness or neck rigidity.  She has no meningismus.  Low suspicion for ACS, great  CVA, PE, dissection.  The patient has been appropriately medically screened and/or stabilized in the ED. I have low suspicion for any other emergent medical condition which would require further screening, evaluation or treatment in the ED or require inpatient management.  Patient is hemodynamically stable and in no acute distress.  Patient able to ambulate in department prior to ED.  Evaluation does not show acute pathology that would require ongoing or additional emergent interventions while in the emergency department or further inpatient treatment.  I have discussed the diagnosis with the  patient and answered all questions.  Pain is been managed while in the emergency department and patient has no further complaints prior to discharge.  Patient is comfortable with plan discussed in room and is stable for discharge at this time.  I have discussed strict return precautions for returning to the emergency department.  Patient was encouraged to follow-up with PCP/specialist refer to at discharge.   MDM Rules/Calculators/A&P                          Rose Hunt was evaluated in Emergency Department on 03/04/2020 for the symptoms described in the history of present illness. She was evaluated in the context of the global COVID-19 pandemic, which necessitated consideration that the patient might be at risk for infection with the SARS-CoV-2 virus that causes COVID-19. Institutional protocols and algorithms that pertain to the evaluation of patients at risk for COVID-19 are in a state of rapid change based on information released by regulatory bodies including the CDC and federal and state organizations. These policies and algorithms were followed during the patient's care in the ED. Final Clinical Impression(s) / ED Diagnoses Final diagnoses:  Myalgia  Person under investigation for COVID-19    Rx / DC Orders ED Discharge Orders         Ordered    naproxen (NAPROSYN) 500 MG tablet  2 times daily        03/04/20 1442    lidocaine (LIDODERM) 5 %  Every 24 hours        03/04/20 1442           Buffi Ewton A, PA-C 03/04/20 1614    Terrilee Files, MD 03/04/20 [phone]

## 2020-03-04 NOTE — ED Notes (Signed)
Pt discharged to home. Discharge instructions have been discussed with patient and/or family members. Pt verbally acknowledges understanding d/c instructions, and endorses comprehension to checkout at registration before leaving.  °

## 2021-06-10 IMAGING — US US PELVIS COMPLETE TRANSABD/TRANSVAG W DUPLEX
1 series · 13 of 25 positions shown · non-contrast
Comparison: Multiple prior studies including study from 01/09/2019

CLINICAL DATA: Left pelvic pain for 3 days

EXAM:
TRANSABDOMINAL AND TRANSVAGINAL ULTRASOUND OF PELVIS
DOPPLER ULTRASOUND OF OVARIES
TECHNIQUE: Both transabdominal and transvaginal ultrasound examinations of the
pelvis were performed. Transabdominal technique was performed for
global imaging of the pelvis including uterus, ovaries, adnexal
regions, and pelvic cul-de-sac.
It was necessary to proceed with endovaginal exam following the
transabdominal exam to visualize the right ovary as it was not seen
transabdominally. Color and duplex Doppler ultrasound was utilized
to evaluate blood flow to the ovaries.

[Series 1: us pelvis complete transabd/transvag w duplex · 13 of 50 slices shown]
[im 1/50]
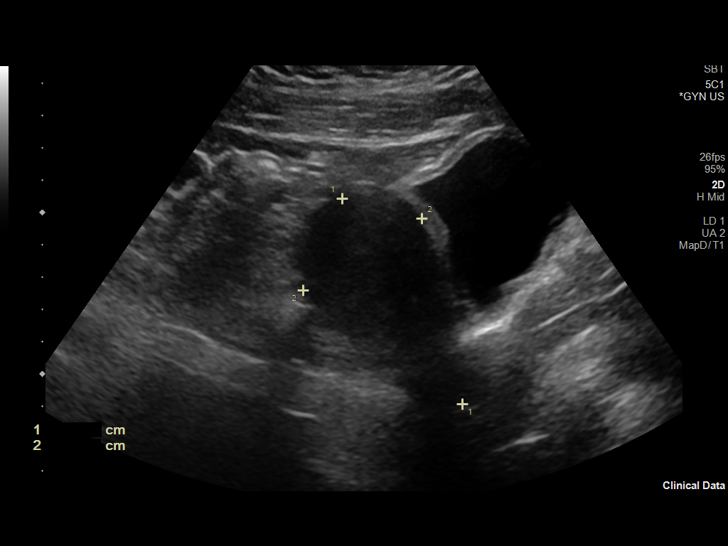
[im 5/50]
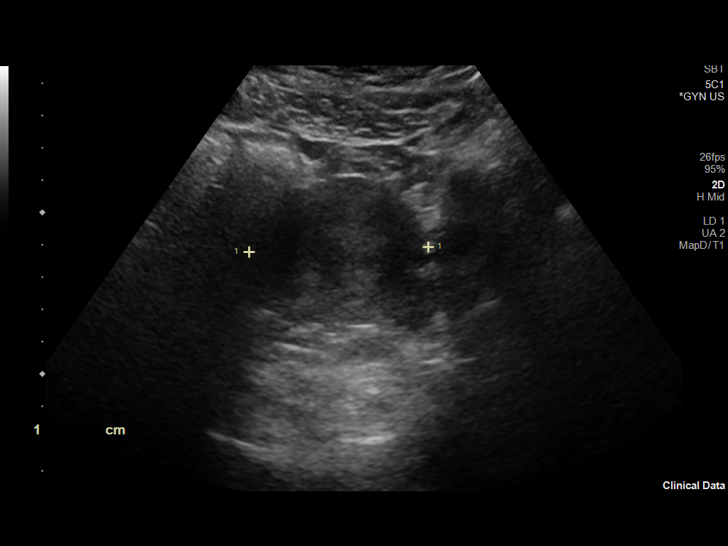
[im 9/50]
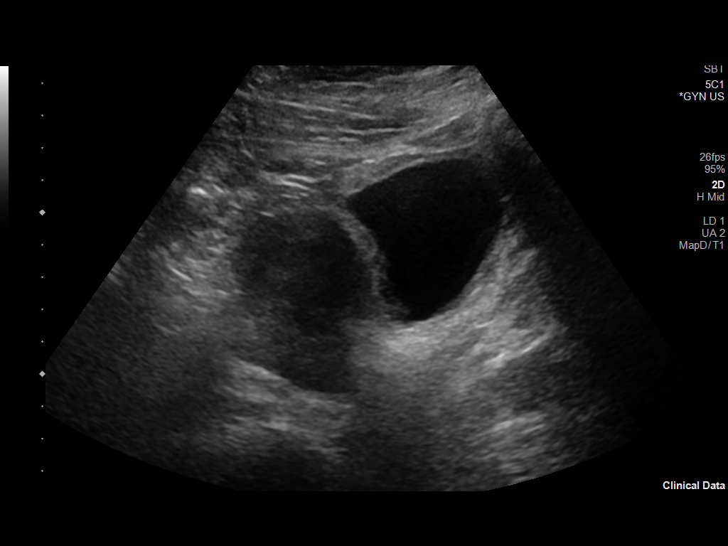
[im 13/50]
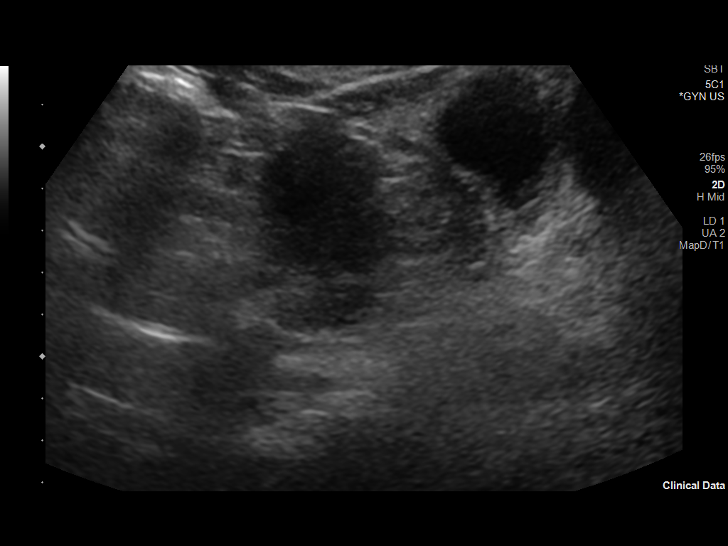
[im 17/50]
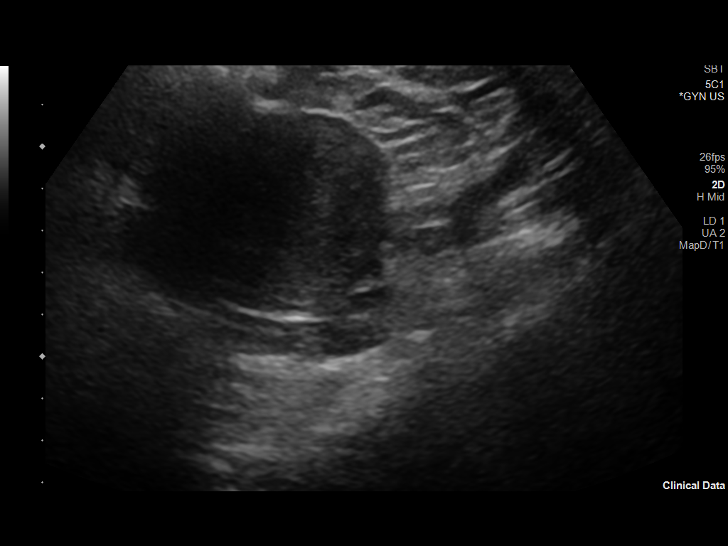
[im 21/50]
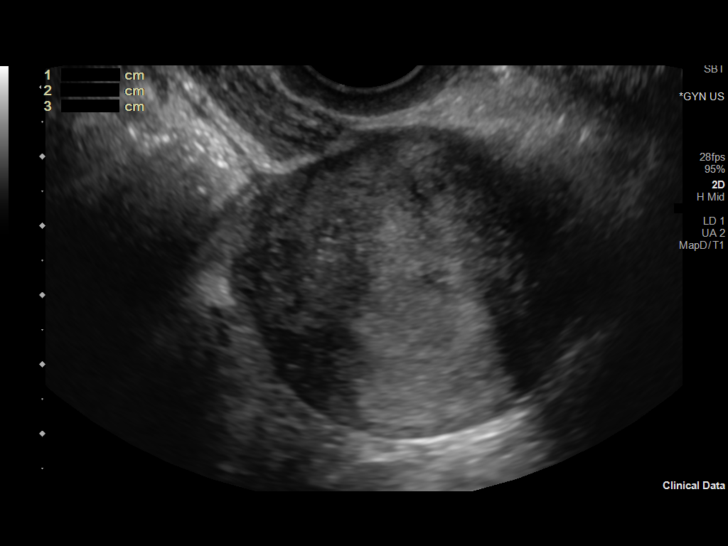
[im 25/50]
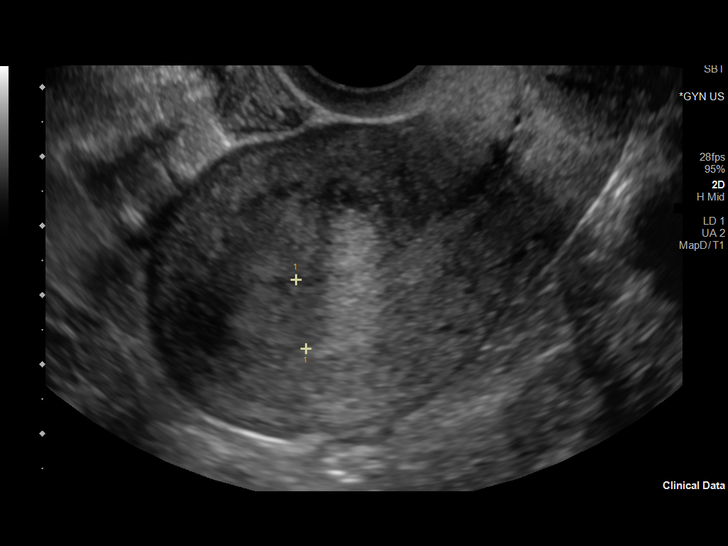
[im 29/50]
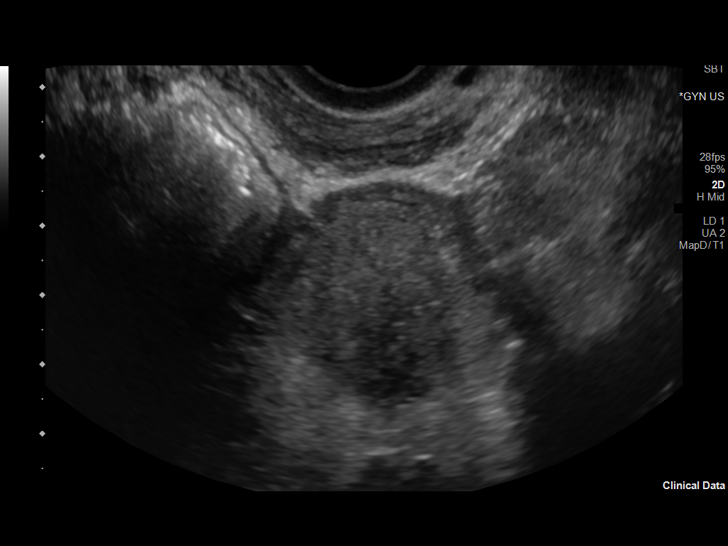
[im 33/50]
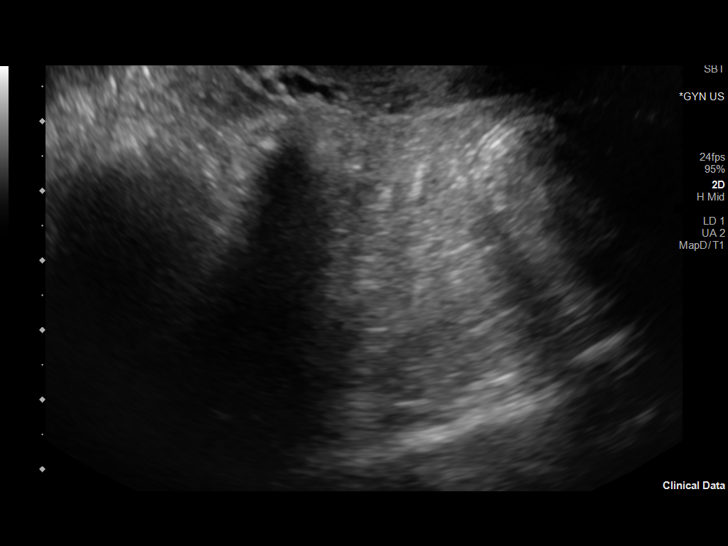
[im 37/50]
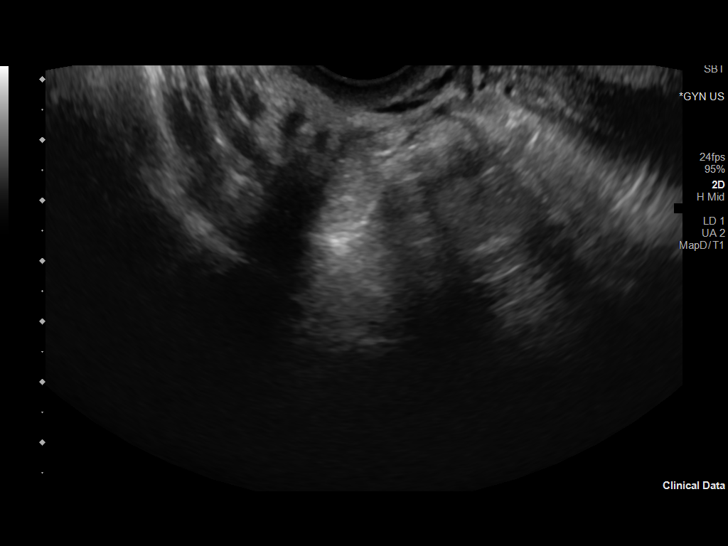
[im 41/50]
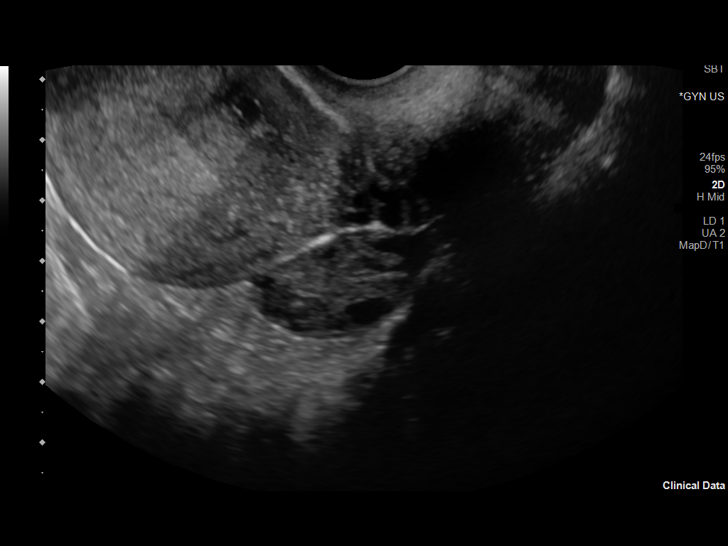
[im 45/50]
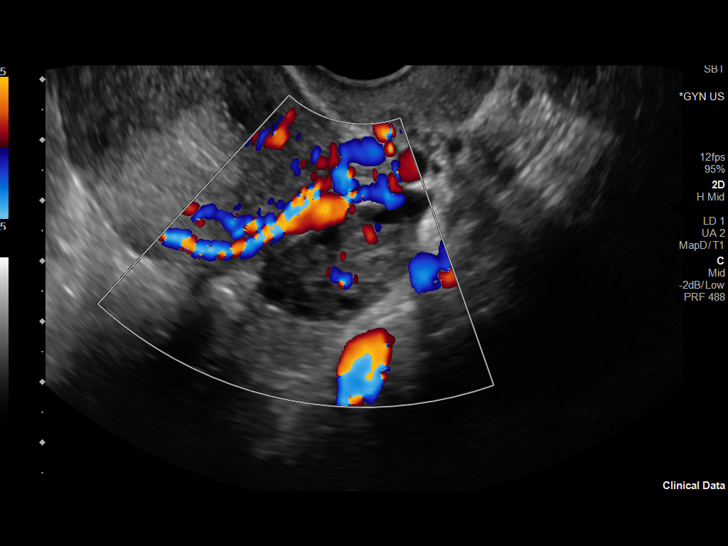
[im 50/50]
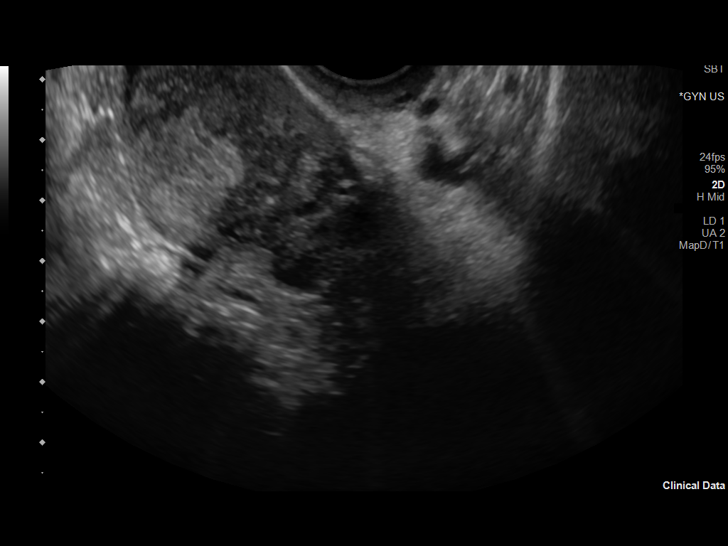

[13 of 25 positions shown; findings below may reference images not displayed]

FINDINGS: Uterus

Measurements: 7.9 x 4.4 x 4.5 = volume: 83 mL. In the anterior mid
uterus there is an ovoid 0.9 x 0.8 x 1 cm area with hypoechoic
characteristics and posterior acoustic enhancement. This areas
unchanged dating back to 4644. In the uterine fundus anteriorly
there is a mildly striated appearance of the myometrium with subtle
areas of increased echogenicity extending through the myometrium.

Endometrium

Thickness: 1.1 cm.

Right ovary

Measurements: Surgically absent based on operative report of
01/10/2019.

Left ovary

Measurements: 3.1 x 1.6 x 2.8 cm = volume: 7 mL. Normal
appearance/no adnexal mass. Normal flow on color and spectral
Doppler within the left ovary.

Pulsed Doppler evaluation of left ovary demonstrates normal
low-resistance arterial and venous waveforms.

Other findings

No abnormal free fluid.
IMPRESSION: 1. Surgically absent right ovary with normal left ovary.
2. No signs of free fluid.
3. Stable appearance of the uterus dating back to 4644 with question
of adenomyosis and small degenerating fibroid. MRI may be helpful
for further assessment as clinically warranted.

## 2022-09-10 ENCOUNTER — Other Ambulatory Visit: Payer: Self-pay

## 2022-09-10 ENCOUNTER — Emergency Department (HOSPITAL_BASED_OUTPATIENT_CLINIC_OR_DEPARTMENT_OTHER): Payer: BC Managed Care – PPO

## 2022-09-10 ENCOUNTER — Emergency Department (HOSPITAL_BASED_OUTPATIENT_CLINIC_OR_DEPARTMENT_OTHER)
Admission: EM | Admit: 2022-09-10 | Discharge: 2022-09-10 | Disposition: A | Discharge status: Home / Self Care | Payer: BC Managed Care – PPO | Attending: Emergency Medicine | Admitting: Emergency Medicine

## 2022-09-10 ENCOUNTER — Other Ambulatory Visit (HOSPITAL_BASED_OUTPATIENT_CLINIC_OR_DEPARTMENT_OTHER): Payer: Self-pay

## 2022-09-10 ENCOUNTER — Encounter (HOSPITAL_BASED_OUTPATIENT_CLINIC_OR_DEPARTMENT_OTHER): Payer: Self-pay | Admitting: Emergency Medicine

## 2022-09-10 DIAGNOSIS — D259 Leiomyoma of uterus, unspecified: Secondary | ICD-10-CM | POA: Diagnosis not present

## 2022-09-10 DIAGNOSIS — R1030 Lower abdominal pain, unspecified: Secondary | ICD-10-CM | POA: Diagnosis present

## 2022-09-10 DIAGNOSIS — D219 Benign neoplasm of connective and other soft tissue, unspecified: Secondary | ICD-10-CM

## 2022-09-10 LAB — COMPREHENSIVE METABOLIC PANEL
ALT: 18 U/L (ref 0–44)
AST: 27 U/L (ref 15–41)
Albumin: 3.4 g/dL — ABNORMAL LOW (ref 3.5–5.0)
Alkaline Phosphatase: 69 U/L (ref 38–126)
Anion gap: 4 — ABNORMAL LOW (ref 5–15)
BUN: 15 mg/dL (ref 6–20)
CO2: 22 mmol/L (ref 22–32)
Calcium: 7.7 mg/dL — ABNORMAL LOW (ref 8.9–10.3)
Chloride: 110 mmol/L (ref 98–111)
Creatinine, Ser: 0.79 mg/dL (ref 0.44–1.00)
GFR, Estimated: >60 mL/min (ref >60)
Glucose, Bld: 90 mg/dL (ref 70–99)
Potassium: 4.2 mmol/L (ref 3.5–5.1)
Sodium: 136 mmol/L (ref 135–145)
Total Bilirubin: 0.7 mg/dL (ref 0.3–1.2)
Total Protein: 6.8 g/dL (ref 6.5–8.1)

## 2022-09-10 LAB — CBC WITH DIFFERENTIAL/PLATELET
Abs Immature Granulocytes: 0.01 10*3/uL (ref 0.00–0.07)
Basophils Absolute: 0 10*3/uL (ref 0.0–0.1)
Basophils Relative: 1 %
Eosinophils Absolute: 0.2 10*3/uL (ref 0.0–0.5)
Eosinophils Relative: 3 %
HCT: 38.1 % (ref 36.0–46.0)
Hemoglobin: 13.6 g/dL (ref 12.0–15.0)
Immature Granulocytes: 0 %
Lymphocytes Relative: 42 %
Lymphs Abs: 2.6 10*3/uL (ref 0.7–4.0)
MCH: 32.9 pg (ref 26.0–34.0)
MCHC: 35.7 g/dL (ref 30.0–36.0)
MCV: 92 fL (ref 80.0–100.0)
Monocytes Absolute: 0.6 10*3/uL (ref 0.1–1.0)
Monocytes Relative: 11 %
Neutro Abs: 2.6 10*3/uL (ref 1.7–7.7)
Neutrophils Relative %: 43 %
Platelets: 273 10*3/uL (ref 150–400)
RBC: 4.14 MIL/uL (ref 3.87–5.11)
RDW: 13.3 % (ref 11.5–15.5)
WBC: 6 10*3/uL (ref 4.0–10.5)
nRBC: 0 % (ref 0.0–0.2)

## 2022-09-10 LAB — URINALYSIS, ROUTINE W REFLEX MICROSCOPIC
Bilirubin Urine: NEGATIVE
Glucose, UA: NEGATIVE mg/dL
Hgb urine dipstick: NEGATIVE
Ketones, ur: NEGATIVE mg/dL
Leukocytes,Ua: NEGATIVE
Nitrite: NEGATIVE
Protein, ur: NEGATIVE mg/dL
Specific Gravity, Urine: 1.025 (ref 1.005–1.030)
pH: 6.5 (ref 5.0–8.0)

## 2022-09-10 LAB — WET PREP, GENITAL
Clue Cells Wet Prep HPF POC: NONE SEEN
Sperm: NONE SEEN
Trich, Wet Prep: NONE SEEN
WBC, Wet Prep HPF POC: <10 (ref <10)
Yeast Wet Prep HPF POC: NONE SEEN

## 2022-09-10 LAB — PREGNANCY, URINE: Preg Test, Ur: NEGATIVE

## 2022-09-10 LAB — LIPASE, BLOOD: Lipase: 28 U/L (ref 11–51)

## 2022-09-10 LAB — HIV ANTIBODY (ROUTINE TESTING W REFLEX): HIV Screen 4th Generation wRfx: NONREACTIVE

## 2022-09-10 MED ORDER — SODIUM CHLORIDE 0.9 % IV SOLN
1000.0000 mL | INTRAVENOUS | Status: DC
  Administered 2022-09-10: 1000 mL via INTRAVENOUS

## 2022-09-10 MED ORDER — HYDROCODONE-ACETAMINOPHEN 5-325 MG PO TABS
1.0000 | ORAL_TABLET | Freq: Four times a day (QID) | ORAL | 0 refills | Status: AC | PRN
  Filled 2022-09-10: qty 12, 3d supply, fill #0

## 2022-09-10 MED ORDER — IOHEXOL 300 MG/ML  SOLN
100.0000 mL | Freq: Once | INTRAMUSCULAR | Status: AC | PRN
  Administered 2022-09-10: 100 mL via INTRAVENOUS

## 2022-09-10 MED ORDER — NAPROXEN 500 MG PO TABS
500.0000 mg | ORAL_TABLET | Freq: Two times a day (BID) | ORAL | 0 refills | Status: AC
  Filled 2022-09-10: qty 14, 7d supply, fill #0

## 2022-09-10 MED ORDER — KETOROLAC TROMETHAMINE 30 MG/ML IJ SOLN
30.0000 mg | Freq: Once | INTRAMUSCULAR | Status: AC
  Administered 2022-09-10: 30 mg via INTRAVENOUS
  Filled 2022-09-10: qty 1

## 2022-09-10 MED ORDER — SODIUM CHLORIDE 0.9 % IV BOLUS (SEPSIS)
500.0000 mL | Freq: Once | INTRAVENOUS | Status: AC
  Administered 2022-09-10: 500 mL via INTRAVENOUS

## 2022-09-10 MED ORDER — MORPHINE SULFATE (PF) 4 MG/ML IV SOLN
4.0000 mg | Freq: Once | INTRAVENOUS | Status: AC
  Administered 2022-09-10: 4 mg via INTRAVENOUS
  Filled 2022-09-10: qty 1

## 2022-09-10 NOTE — Discharge Instructions (Addendum)
The CT scan and ultrasound did not show any signs of acute infection, obstruction or abscess.  Follow-up with your [facility] doctor today as planned for further evaluation.  Take the medications as prescribed for pain and discomfort

## 2022-09-10 NOTE — ED Triage Notes (Signed)
Pt arrives pov, slow gait with c/o RLQ pain x 1 week. Denies dysuria. Pt also reports bilateral LE pain Tylenol pta

## 2022-09-10 NOTE — ED Provider Notes (Signed)
[place] [facility] [place] Provider Note   CSN: [id number] Arrival date & time: 09/10/22  0700     History  Chief Complaint  Patient presents with   Abdominal Pain    Rose Hunt is a 37 y.o. female.   Abdominal Pain    Patient presents to the emergency room for evaluation of abdominal pain ongoing for few months.  Pain is in her lower abdomen on both sides.  It goes towards her thighs.  Initially she was just trying to manage. .  She did schedule an appointment with her doctor but accidentally missed the appointment.  She was scheduled to be seen later today but could not wait.  Patient states the pain has been increasing in severity.  It does get worse with her menstrual periods.  She denies any vaginal discharge.  She denies any dysuria.  She does not have any vomiting or diarrhea.  Home Medications Prior to Admission medications   Medication Sig Start Date End Date Taking? Authorizing Provider  HYDROcodone-acetaminophen (NORCO/VICODIN) 5-325 MG tablet Take 1 tablet by mouth every 6 (six) hours as needed. 09/10/22  Yes Dorie Rank, MD  naproxen (NAPROSYN) 500 MG tablet Take 1 tablet (500 mg total) by mouth 2 (two) times daily with a meal for 7 days. As needed for pain 09/10/22 09/17/22 Yes Dorie Rank, MD  lidocaine (LIDODERM) 5 % Place 1 patch onto the skin daily. Remove & Discard patch within 12 hours or as directed by MD 03/04/20   Henderly, Britni A, PA-C      Allergies    Patient has no known allergies.    Review of Systems   Review of Systems  Gastrointestinal:  Positive for abdominal pain.    Physical Exam Updated Vital Signs BP (!) 152/100   Pulse 60   Temp (!) 97.5 F (36.4 C) (Oral)   Resp 20   Ht 1.575 m (5\' 2" )   Wt 103 kg   SpO2 99%   BMI 41.52 kg/m  Physical Exam Vitals and nursing note reviewed.  Constitutional:      Appearance: She is well-developed. She is not diaphoretic.  HENT:     Head: Normocephalic and  atraumatic.     Right Ear: External ear normal.     Left Ear: External ear normal.  Eyes:     General: No scleral icterus.       Right eye: No discharge.        Left eye: No discharge.     Conjunctiva/sclera: Conjunctivae normal.  Neck:     Trachea: No tracheal deviation.  Cardiovascular:     Rate and Rhythm: Normal rate and regular rhythm.  Pulmonary:     Effort: Pulmonary effort is normal. No respiratory distress.     Breath sounds: Normal breath sounds. No stridor. No wheezing or rales.  Abdominal:     General: Bowel sounds are normal. There is no distension.     Palpations: Abdomen is soft.     Tenderness: There is abdominal tenderness in the right lower quadrant, suprapubic area and left lower quadrant. There is no guarding or rebound.  Genitourinary:    Cervix: No discharge, friability or erythema.     Uterus: Tender.      Adnexa:        Right: Tenderness present. No mass or fullness.         Left: No mass or fullness.    Musculoskeletal:  General: No tenderness or deformity.     Cervical back: Neck supple.     Comments: Extremities are warm and well-perfused.  No edema noted no erythema normal pulses  Skin:    General: Skin is warm and dry.     Findings: No rash.  Neurological:     General: No focal deficit present.     Mental Status: She is alert.     Cranial Nerves: No cranial nerve deficit, dysarthria or facial asymmetry.     Sensory: No sensory deficit.     Motor: No abnormal muscle tone or seizure activity.     Coordination: Coordination normal.  Psychiatric:        Mood and Affect: Mood normal.     ED Results / Procedures / Treatments   Labs (all labs ordered are listed, but only abnormal results are displayed) Labs Reviewed  COMPREHENSIVE METABOLIC PANEL - Abnormal; Notable for the following components:      Result Value   Calcium 7.7 (*)    Albumin 3.4 (*)    Anion gap 4 (*)    All other components within normal limits  WET PREP, GENITAL   CBC WITH DIFFERENTIAL/PLATELET  URINALYSIS, ROUTINE W REFLEX MICROSCOPIC  PREGNANCY, URINE  LIPASE, BLOOD  HIV ANTIBODY (ROUTINE TESTING W REFLEX)  RPR  GC/CHLAMYDIA PROBE AMP () NOT AT [facility]    EKG None  Radiology CT ABDOMEN PELVIS W CONTRAST  Result Date: 09/10/2022 CLINICAL DATA:  One-week history of right lower quadrant abdominal pain EXAM: CT ABDOMEN AND PELVIS WITH CONTRAST TECHNIQUE: Multidetector CT imaging of the abdomen and pelvis was performed using the standard protocol following bolus administration of intravenous contrast. RADIATION DOSE REDUCTION: This exam was performed according to the departmental dose-optimization program which includes automated exposure control, adjustment of the mA and/or kV according to patient size and/or use of iterative reconstruction technique. CONTRAST:  161mL OMNIPAQUE IOHEXOL 300 MG/ML  SOLN COMPARISON:  None Available. FINDINGS: Lower chest: No focal consolidation or pulmonary nodule in the lung bases. No pleural effusion or pneumothorax demonstrated. Partially imaged heart size is normal. Hepatobiliary: No focal hepatic lesions. No intra or extrahepatic biliary ductal dilation. Normal gallbladder. Pancreas: No focal lesions or main ductal dilation. Spleen: Normal in size without focal abnormality. Adrenals/Urinary Tract: No adrenal nodules. No suspicious renal mass, calculi or hydronephrosis. Subcentimeter right lower pole hypoattenuating focus, too small to characterize but most likely a cyst. No specific follow-up imaging recommended. No focal bladder wall thickening. Stomach/Bowel: Normal appearance of the stomach. No evidence of bowel wall thickening, distention, or inflammatory changes. Normal appendix. Vascular/Lymphatic: No significant vascular findings are present. No enlarged abdominal or pelvic lymph nodes. Reproductive: Status post right salpingo-oophorectomy. No left adnexal masses. Multiple enhancing foci within the uterus in  keeping with leiomyomas noted on same-day pelvic ultrasound examination. Other: No free fluid, fluid collection, or free air. Musculoskeletal: No acute or abnormal lytic or blastic osseous lesions. IMPRESSION: 1. No acute abdominopelvic abnormality. Normal appendix. 2. Status post right salpingo-oophorectomy. 3. Multiple enhancing foci within the uterus in keeping with leiomyomas noted on same-day pelvic ultrasound examination. Electronically Signed   By: Darrin Nipper M.D.   On: 09/10/2022 11:27   US PELVIC COMPLETE W TRANSVAGINAL AND TORSION R/O  Result Date: 09/10/2022 CLINICAL DATA:  Right lower quadrant abdominal and pelvic pain. History of right oophorectomy and salpingectomy. LMP 08/24/2022. EXAM: TRANSABDOMINAL AND TRANSVAGINAL ULTRASOUND OF PELVIS DOPPLER ULTRASOUND OF OVARIES TECHNIQUE: Both transabdominal and transvaginal ultrasound examinations of the  pelvis were performed. Transabdominal technique was performed for global imaging of the pelvis including uterus, ovaries, adnexal regions, and pelvic cul-de-sac. It was necessary to proceed with endovaginal exam following the transabdominal exam to visualize the endometrium and ovaries to better advantage. Color and duplex Doppler ultrasound was utilized to evaluate blood flow to the ovaries. COMPARISON:  Pelvic ultrasound 04/04/2019. Abdominopelvic CT 08/11/2021 is not available for direct comparison. FINDINGS: Uterus Measurements: 9.1 x 4.1 x 5.2 cm = volume: 100.4 mL. Tiny uterine fibroids are noted measuring up to 1.3 cm in diameter. Endometrium Thickness: 8 mm.  No focal abnormality visualized. Right ovary Surgically absent.  No evidence of adnexal mass. Left ovary Measurements: 3.5 x 2.6 x 2.2 cm = volume: 10.7 mL. Normal appearance/no adnexal mass. Pulsed Doppler evaluation of left ovary demonstrates normal low-resistance arterial and venous waveforms. Other findings No abnormal free fluid. IMPRESSION: 1. No acute abnormality identified in the  pelvis. 2. Stable tiny uterine fibroids. 3. Normal appearance of the left ovary.  No evidence of torsion. 4. Status post right oophorectomy. Electronically Signed   By: Richardean Sale M.D.   On: 09/10/2022 10:13    Procedures Procedures    Medications Ordered in ED Medications  sodium chloride 0.9 % bolus 500 mL (0 mLs Intravenous Stopped 09/10/22 0835)    Followed by  0.9 %  sodium chloride infusion (0 mLs Intravenous Stopped 09/10/22 1150)  ketorolac (TORADOL) 30 MG/ML injection 30 mg (30 mg Intravenous Given 09/10/22 0752)  morphine (PF) 4 MG/ML injection 4 mg (4 mg Intravenous Given 09/10/22 1051)  iohexol (OMNIPAQUE) 300 MG/ML solution 100 mL (100 mLs Intravenous Contrast Given 09/10/22 1104)    ED Course/ Medical Decision Making/ A&P Clinical Course as of 09/10/22 1157  Fri Sep 10, 2022  0849 Wet prep negative CBC normal [JK]  1018 Ultrasound does not show any acute abnormalities. [JK]  1139 Comprehensive metabolic panel(!) Metabolic panel is normal. [JK]  1139 CT scan without acute abnormality. [JK]    Clinical Course User Index [JK] Dorie Rank, MD                             Medical Decision Making Differential diagnosis includes but not limited to diverticulitis, pyelonephritis, ureterolithiasis, intorsion  Amount and/or Complexity of Data Reviewed Labs: ordered. Decision-making details documented in ED Course. Radiology: ordered and independent interpretation performed.  Risk Prescription drug management. Parenteral controlled substances.   Patient presented to the ED for evaluation abdominal pain.ED workup reassuring.  No findings ofAppendicitis, DiverticulitisOr other acute abnormality on abdominal pelvic CT.Pelvic ultrasound does not show any evidence of ovarian torsion.  Uterine fibroids noted although not clearly the source of her symptoms.  Laboratory test otherwise unremarkable and patient does not have any findings to suggest pelvic infection patient is  scheduled to see her OB doctor this afternoon.  At this time no signs of any emergent medical condition and she appears appropriate for discharge and outpatient follow-up.        Final Clinical Impression(s) / ED Diagnoses Final diagnoses:  Lower abdominal pain  Fibroids    Rx / DC Orders ED Discharge Orders          Ordered    naproxen (NAPROSYN) 500 MG tablet  2 times daily with meals        09/10/22 1154    HYDROcodone-acetaminophen (NORCO/VICODIN) 5-325 MG tablet  Every 6 hours PRN        09/10/22  Solomon, Ranay Ketter, MD 09/10/22 1158

## 2022-09-10 NOTE — ED Notes (Signed)
Triage delayed. Pt in restroom

## 2022-09-11 LAB — RPR: RPR Ser Ql: NONREACTIVE

## 2022-09-13 LAB — GC/CHLAMYDIA PROBE AMP (~~LOC~~) NOT AT ARMC
Chlamydia: NEGATIVE
Comment: NEGATIVE
Comment: NORMAL
Neisseria Gonorrhea: NEGATIVE

## 2023-01-04 ENCOUNTER — Encounter (HOSPITAL_BASED_OUTPATIENT_CLINIC_OR_DEPARTMENT_OTHER): Payer: Self-pay

## 2023-01-04 ENCOUNTER — Other Ambulatory Visit: Payer: Self-pay

## 2023-01-04 ENCOUNTER — Emergency Department (HOSPITAL_BASED_OUTPATIENT_CLINIC_OR_DEPARTMENT_OTHER)
Admission: EM | Admit: 2023-01-04 | Discharge: 2023-01-04 | Disposition: A | Discharge status: Home / Self Care | Payer: BC Managed Care – PPO | Attending: Emergency Medicine | Admitting: Emergency Medicine

## 2023-01-04 DIAGNOSIS — N760 Acute vaginitis: Secondary | ICD-10-CM | POA: Diagnosis not present

## 2023-01-04 DIAGNOSIS — B9689 Other specified bacterial agents as the cause of diseases classified elsewhere: Secondary | ICD-10-CM | POA: Diagnosis not present

## 2023-01-04 DIAGNOSIS — R1030 Lower abdominal pain, unspecified: Secondary | ICD-10-CM | POA: Diagnosis present

## 2023-01-04 DIAGNOSIS — N739 Female pelvic inflammatory disease, unspecified: Secondary | ICD-10-CM | POA: Insufficient documentation

## 2023-01-04 DIAGNOSIS — N3090 Cystitis, unspecified without hematuria: Secondary | ICD-10-CM

## 2023-01-04 DIAGNOSIS — N730 Acute parametritis and pelvic cellulitis: Secondary | ICD-10-CM

## 2023-01-04 LAB — URINALYSIS, ROUTINE W REFLEX MICROSCOPIC
Bilirubin Urine: NEGATIVE
Glucose, UA: NEGATIVE mg/dL
Hgb urine dipstick: NEGATIVE
Ketones, ur: NEGATIVE mg/dL
Nitrite: POSITIVE — AB
Protein, ur: NEGATIVE mg/dL
Specific Gravity, Urine: 1.025 (ref 1.005–1.030)
pH: 7 (ref 5.0–8.0)

## 2023-01-04 LAB — CBC WITH DIFFERENTIAL/PLATELET
Abs Immature Granulocytes: 0.02 10*3/uL (ref 0.00–0.07)
Basophils Absolute: 0 10*3/uL (ref 0.0–0.1)
Basophils Relative: 1 %
Eosinophils Absolute: 0.2 10*3/uL (ref 0.0–0.5)
Eosinophils Relative: 3 %
HCT: 38.9 % (ref 36.0–46.0)
Hemoglobin: 13.7 g/dL (ref 12.0–15.0)
Immature Granulocytes: 0 %
Lymphocytes Relative: 46 %
Lymphs Abs: 3.6 10*3/uL (ref 0.7–4.0)
MCH: 33.3 pg (ref 26.0–34.0)
MCHC: 35.2 g/dL (ref 30.0–36.0)
MCV: 94.6 fL (ref 80.0–100.0)
Monocytes Absolute: 0.6 10*3/uL (ref 0.1–1.0)
Monocytes Relative: 8 %
Neutro Abs: 3.3 10*3/uL (ref 1.7–7.7)
Neutrophils Relative %: 42 %
Platelets: 238 10*3/uL (ref 150–400)
RBC: 4.11 MIL/uL (ref 3.87–5.11)
RDW: 13.9 % (ref 11.5–15.5)
WBC: 7.8 10*3/uL (ref 4.0–10.5)
nRBC: 0 % (ref 0.0–0.2)

## 2023-01-04 LAB — WET PREP, GENITAL
Sperm: NONE SEEN
Trich, Wet Prep: NONE SEEN
WBC, Wet Prep HPF POC: >=10 — AB (ref <10)
Yeast Wet Prep HPF POC: NONE SEEN

## 2023-01-04 LAB — COMPREHENSIVE METABOLIC PANEL
ALT: 15 U/L (ref 0–44)
AST: 22 U/L (ref 15–41)
Albumin: 3.5 g/dL (ref 3.5–5.0)
Alkaline Phosphatase: 61 U/L (ref 38–126)
Anion gap: 9 (ref 5–15)
BUN: 19 mg/dL (ref 6–20)
CO2: 23 mmol/L (ref 22–32)
Calcium: 8.7 mg/dL — ABNORMAL LOW (ref 8.9–10.3)
Chloride: 104 mmol/L (ref 98–111)
Creatinine, Ser: 1.07 mg/dL — ABNORMAL HIGH (ref 0.44–1.00)
GFR, Estimated: >60 mL/min (ref >60)
Glucose, Bld: 93 mg/dL (ref 70–99)
Potassium: 3.9 mmol/L (ref 3.5–5.1)
Sodium: 136 mmol/L (ref 135–145)
Total Bilirubin: 0.4 mg/dL (ref 0.3–1.2)
Total Protein: 6.8 g/dL (ref 6.5–8.1)

## 2023-01-04 LAB — PREGNANCY, URINE: Preg Test, Ur: NEGATIVE

## 2023-01-04 LAB — URINALYSIS, MICROSCOPIC (REFLEX)

## 2023-01-04 MED ORDER — METRONIDAZOLE 500 MG PO TABS: 500.0000 mg | ORAL_TABLET | Freq: Two times a day (BID) | ORAL | 0 refills | Status: AC

## 2023-01-04 MED ORDER — FOSFOMYCIN TROMETHAMINE 3 G PO PACK
3.0000 g | PACK | Freq: Once | ORAL | Status: AC
  Administered 2023-01-04: 3 g via ORAL
  Filled 2023-01-04: qty 3

## 2023-01-04 MED ORDER — METRONIDAZOLE 500 MG PO TABS: 500.0000 mg | ORAL_TABLET | Freq: Two times a day (BID) | ORAL | 0 refills | Status: DC

## 2023-01-04 MED ORDER — DOXYCYCLINE HYCLATE 100 MG PO CAPS: 100.0000 mg | ORAL_CAPSULE | Freq: Two times a day (BID) | ORAL | 0 refills | Status: DC

## 2023-01-04 MED ORDER — LIDOCAINE HCL (PF) 1 % IJ SOLN
INTRAMUSCULAR | Status: AC
  Administered 2023-01-04: 5 mL
  Filled 2023-01-04: qty 5

## 2023-01-04 MED ORDER — DOXYCYCLINE HYCLATE 100 MG PO CAPS: 100.0000 mg | ORAL_CAPSULE | Freq: Two times a day (BID) | ORAL | 0 refills | Status: AC

## 2023-01-04 MED ORDER — MORPHINE SULFATE (PF) 4 MG/ML IV SOLN
4.0000 mg | Freq: Once | INTRAVENOUS | Status: AC
  Administered 2023-01-04: 4 mg via INTRAVENOUS
  Filled 2023-01-04: qty 1

## 2023-01-04 MED ORDER — CEFTRIAXONE SODIUM 500 MG IJ SOLR
500.0000 mg | Freq: Once | INTRAMUSCULAR | Status: AC
  Administered 2023-01-04: 500 mg via INTRAMUSCULAR
  Filled 2023-01-04: qty 500

## 2023-01-04 NOTE — ED Triage Notes (Signed)
Pt reports lower abdominal pain onset Friday that radiates to her back associated  with white thick and clumpy vaginal discharge, described as "cottage cheese looking" that started today.

## 2023-01-04 NOTE — ED Notes (Signed)
Pt does report some vaginal discharge, that is "white and clumpy"

## 2023-01-04 NOTE — Discharge Instructions (Addendum)
Please be advised that sexual partners patient to be treated for STI.    Use protection for sexual intercourse for the next 3 to 4 weeks.

## 2023-01-04 NOTE — ED Provider Notes (Addendum)
[place] [facility] [place] Provider Note   CSN: [id number] Arrival date & time: 01/04/23  2030     History Chief Complaint  Patient presents with   Abdominal Pain     Abdominal Pain  Rose Hunt is a 37 y.o. female presenting for lower abdominal pain.  She reports 2-3 days of cramping lower abdominal pain, that moves across to her back. She denies nausea, vomiting or fever. She has tried ibuprofen which helped somewhat.  She denies dysuria, endorses increased urinary frequency and thick vaginal discharge that started today. LMP was about 3-4 weeks ago.   Patient's recorded medical, surgical, social, medication list and allergies were reviewed in the Snapshot window as part of the initial history.   Review of Systems   Review of Systems  Gastrointestinal:  Positive for abdominal pain.    Physical Exam Updated Vital Signs BP (!) 125/93   Pulse 77   Temp 99.1 F (37.3 C) (Oral)   Resp 18   Ht 5\' 2"  (1.575 m)   Wt 99.3 kg   LMP 12/08/2022 (Approximate)   SpO2 98%   BMI 40.06 kg/m  Physical Exam General: Pleasant, well-appearing. No acute distress. CV: RRR. No murmurs, rubs, or gallops. No LE edema Pulmonary: Lungs CTAB. Normal effort. No wheezing or rales. Abdominal: Soft, mild tenderness in the lower abdomen. normal bowel sounds. GU: Thick cervical discharge, with no uterine tenderness.  No adnexal tenderness appreciated.   Psych: Normal mood and affect   ED Course/ Medical Decision Making/ A&P    Procedures Procedures   Medications Ordered in ED Medications  fosfomycin (MONUROL) packet 3 g (has no administration in time range)  morphine (PF) 4 MG/ML injection 4 mg (4 mg Intravenous Given 01/04/23 2157)  cefTRIAXone (ROCEPHIN) injection 500 mg (500 mg Intramuscular Given 01/04/23 2200)  lidocaine (PF) (XYLOCAINE) 1 % injection (5 mLs  Given 01/04/23 2201)    Medical Decision Making:    Rose Hunt is a 37 y.o. female who  presented to the ED today with lower abdominal pain detailed above.     Patient placed on continuous vitals and telemetry monitoring while in ED which was reviewed periodically.   Complete initial physical exam performed, notably the patient  was stable. Mild tenderness to the lower abdomen.  No signs of peritonitis or guarding on exam.   Reviewed and confirmed nursing documentation for past medical history, family history, social history.    Initial Assessment:   With the patient's presentation of lower abdominal pain, possible differential diagnosis includes PID vs. appendicitis vs. ovarian etiology. These are considered less likely due to history of present illness and physical exam findings.   This is most consistent with an acute complicated illness  Initial Plan:  Pelvic exam Pregnancy test. Screening labs including CBC and Metabolic panel to evaluate for infectious or metabolic etiology of disease.  Urinalysis with reflex culture ordered to evaluate for UTI or relevant urologic/nephrologic pathology.  Wet prep Morphine for pain  Objective evaluation as below reviewed with plan for close reassessment  Initial Study Results:   Laboratory  All laboratory results reviewed without evidence of clinically relevant pathology.   Exceptions include: Positive clue cells on wet prep   Reassessment and Plan:   She is afebrile, no leukocytosis. UA positive for UTI. pregnancy test is negative Patient will be treated with a single dose of fosfomycin 3 g p.o.  Pelvic exam shows mucopurulent cervical discharge, and mild uterine tenderness.  No adnexal  tenderness. She is sexually active, and is agreeable to treatment for both chlamydia and gonorrhea she was educated about advising sexual partners in the past 3 advised to also get treatment for STI. I suspect the lower abdominal pain is secondary to pelvic inflammatory disease.   She will be treated with IM Rocephin, and complete 14 days of p.o.  doxycycline.  She also has positive clue cells on wet prep, concerning for bacterial vaginosis.  She will be treated with 7-day course of metronidazole 500 mg.     Clinical Impression:  1. PID (acute pelvic inflammatory disease)   2. Bacterial vaginosis   3. Cystitis      Discharge   Final Clinical Impression(s) / ED Diagnoses Final diagnoses:  PID (acute pelvic inflammatory disease)  Bacterial vaginosis  Cystitis    Rx / DC Orders ED Discharge Orders          Ordered    metroNIDAZOLE (FLAGYL) 500 MG tablet  2 times daily,   Status:  Discontinued        01/04/23 2154    doxycycline (VIBRAMYCIN) 100 MG capsule  2 times daily,   Status:  Discontinued        01/04/23 2157    doxycycline (VIBRAMYCIN) 100 MG capsule  2 times daily        01/04/23 2232    metroNIDAZOLE (FLAGYL) 500 MG tablet  2 times daily        01/04/23 2233              Laretta Bolster, MD 01/04/23 2236    Laretta Bolster, MD 01/04/23 2237    Melene Plan, DO 01/04/23 2241

## 2023-01-05 LAB — GC/CHLAMYDIA PROBE AMP (~~LOC~~) NOT AT ARMC
Chlamydia: NEGATIVE
Comment: NEGATIVE
Comment: NORMAL
Neisseria Gonorrhea: NEGATIVE

## 2023-01-05 LAB — HIV ANTIBODY (ROUTINE TESTING W REFLEX): HIV Screen 4th Generation wRfx: NONREACTIVE

## 2023-01-05 LAB — RPR: RPR Ser Ql: NONREACTIVE

## 2023-11-10 NOTE — Progress Notes (Signed)
 Patient presents for  Chief Complaint  Patient presents with  . Annual Exam    Other providers: 1. Psychiatry - [facility] - Olam Ricker, NP  Labs collected. NLR,CMA HPI  Pap 09/09/21 - nilm, hpv neg   ---LMP:10/08/2023 ---Cycle regular? Yes ---Contraceptive Method: Condoms  ---Mammogram:01/10/23, Repeat 1 year ---Pap Testing:09/09/21, Repeat 5 years   Immunizations:  Immunizations by Immunization Family     Covid-19 Vaccine Unspecified 06/26/2020 (38 y.o.) 07/18/2020 (38 y.o.)     DTP 06/15/1985 (2 m.o.) 09/06/1985 (5 m.o.) 10/27/1987 (30 m.o.) 04/11/1990 (38 y.o.)   Hep B, Unspecified 04/10/1996 (38 y.o.) 05/22/1996 (38 y.o.) 09/18/1996 (38 y.o.)    HiB, Unspecified 10/27/1987 (30 m.o.)      Influenza, Unspecified 05/11/1994 (38 y.o.)      MMR 10/05/1990 (38 y.o.)      OPV 06/15/1985 (2 m.o.) 09/06/1985 (5 m.o.) 10/27/1987 (30 m.o.) 04/11/1990 (38 y.o.)   TDAP VACCINE (BOOSTRIX,ADACEL) 7Y+ 06/30/2015 (38 y.o.)          Health Maintenance Status       Date Due Completion Dates   Varicella Vaccines (1 of 2 - 13+ 2-dose series) Never done ---   Influenza Vaccine (1) 01/27/2023 05/11/1994   COVID-19 Vaccine (3 - 2024-25 season) 02/27/2023 07/18/2020, 06/26/2020   Comprehensive Annual Visit 04/28/2023 04/27/2022, 01/07/2021   Depression Screening 07/14/2024 07/15/2023   Diabetes Screening 11/06/2024 11/07/2023, 08/03/2021   DTaP/Tdap/Td Vaccines (6 - Td or Tdap) 06/29/2025 06/30/2015, 04/11/1990   Cervical Cancer Screening 09/10/2026 09/09/2021, 09/09/2021   ZOSTER VACCINE (1 of 2) 04/04/2035 ---   Adult RSV (60+ Years or Pregnancy) (1 - 1-dose 75+ series) 04/03/2060 ---       ---LMP:10/08/2023 ---Cycle regular? Yes ---Contraceptive Method: Condoms  ---Cardiac studies: CXR 06/20/18  ---Mammogram:01/10/23, Repeat 1 year ---Pap Testing:09/09/21, Repeat 5 years      Anxiety Currently on Buspar 15 mg PRN  Patient reports moods have been up and down Having more good days than bad days  Does  not sleep too well. States she tends to think a lot at night. Occasionally takes a sleep aide, tylenol  pm. Does not think she snores but is not sure. Feels tired throughout the day but does not nap. Sometimes wakes up with headaches.  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 Has changed her mattress. Has tried using an ortho ball.  Has been through PT. Has not seen ortho. Xr lumbar ordered 11/2022 but pt did not get it done. States she does not want this done at this time.   New problems/Concerns:  Patient c/o left arm pain x 1 month Unsure of cause She had a fall, but arm pain was present prior to fall Pain got worse after fall  Pain occurs mostly at night, mostly it she lays on it too long No numbness tingling Pain worse with certain movements. restricted from some movements Taking Tylenol  PRN   macrocytosis Has 2-3 drinks 3 days of the week. Has been doing this for a long time.   No Known Allergies   Current Outpatient Medications:  .  busPIRone (BUSPAR) 15 mg tablet, Take 15 mg by mouth 2 (two) times a day. (Patient taking differently: Take 15 mg by mouth 2 (two) times a day. As needed),  Disp: 60 tablet, Rfl: 3 .  tiZANidine (ZANAFLEX) 4 mg tablet, TAKE 1 TABLET BY MOUTH EVERY 6 (SIX) HOURS AS NEEDED FOR BACK PAIN/MUSCLE SPASM, Disp: 30 tablet, Rfl: 0  The following portions of the patient's history were reviewed and updated as appropriate: allergies, current medications, past medical history, past surgical history, past social history, past family history, and problem list.    REVIEW OF SYSTEMS   Per HPI  PHYSICAL EXAM  BP 137/88 (BP Location: Right arm, Patient Position: Sitting)   Pulse 73   Ht 1.588 m (5' 2.5)   Wt 97.5 kg (215 lb)    SpO2 100%   BMI 38.70 kg/m  Wt Readings from Last 3 Encounters:  11/10/23 97.5 kg (215 lb)  07/15/23 94.3 kg (208 lb)  01/10/23 97.7 kg (215 lb 6.4 oz)    Physical Exam Constitutional:      Comments: wd, wn, pleasant, nad  HENT:     Right Ear: Tympanic membrane and ear canal normal.     Left Ear: Tympanic membrane and ear canal normal.     Mouth/Throat:     Mouth: Mucous membranes are moist.     Pharynx: Oropharynx is clear.  Eyes:     Extraocular Movements: Extraocular movements intact.     Conjunctiva/sclera: Conjunctivae normal.     Pupils: Pupils are equal, round, and reactive to light.  Neck:     Comments: supple, nontender, no cervical adenopathy; Thyroid - no thyromegaly, no nodules, nontender Cardiovascular:     Rate and Rhythm: Normal rate and regular rhythm.     Heart sounds: Normal heart sounds. No murmur heard. Pulmonary:     Effort: Pulmonary effort is normal. No respiratory distress.     Breath sounds: Normal breath sounds. No wheezing.  Abdominal:     General: Bowel sounds are normal. There is no distension.     Palpations: Abdomen is soft. There is no mass.     Tenderness: There is no abdominal tenderness. There is no guarding.  Musculoskeletal:     Right lower leg: No edema.     Left lower leg: No edema.     Comments: 5/5 strength throughout  Left shoulder - mild tenderness over biceps tendon, limited ROM as pt did not want to complete flexion, extension, internal/external rotation, abduction as she stated it was uncomfortable. Neg empty can testing.  Skin:    General: Skin is warm and dry.  Neurological:     Comments: Cns II-XII intact, no focal deficits, 2+ DTR symmetric throughout  Psychiatric:        Mood and Affect: Mood normal.        Behavior: Behavior normal.     Chaperone not needed or present for exam.    Patient was offered the option to change into a gown for exam today and declined to change into gown/sheet combination for exam.  .  Results for orders placed or performed in visit on 11/07/23  Lipid Panel   Collection Time: 11/07/23  1:51 PM  Result Value Ref Range   Cholesterol, Total, Lipid Panel 136 <200 mg/dL   Triglycerides, Lipid Panel 113 <150 mg/dL   HDL Cholesterol - Lipid Panel 60 >=60 mg/dL   LDL Cholesterol, Calculated 56 <100 mg/dL   Non-HDL Cholesterol 76 mg/dL  TSH With Reflex To Free T4   Collection Time: 11/07/23  1:51 PM  Result Value Ref Range   TSH 2.267 0.450 - 5.330 uIU/mL  Comprehensive Metabolic Panel   Collection  Time: 11/07/23  1:51 PM  Result Value Ref Range   Sodium 137 136 - 145 mmol/L   Potassium 3.7 3.5 - 5.1 mmol/L   Chloride 107 98 - 107 mmol/L   CO2 23 21 - 31 mmol/L   Anion Gap 7 6 - 14 mmol/L   Glucose, Random 91 70 - 99 mg/dL   Blood Urea Nitrogen (BUN) 8 7 - 25 mg/dL   Creatinine 9.25 9.39 - 1.20 mg/dL   eGFR >09 >40 fO/fpw/8.26f7   Albumin 4.0 3.5 - 5.7 g/dL   Total Protein 6.5 6.4 - 8.9 g/dL   Bilirubin, Total 0.9 0.3 - 1.0 mg/dL   Alkaline Phosphatase (ALP) 67 34 - 104 U/L   Aspartate Aminotransferase (AST) 21 13 - 39 U/L   Alanine Aminotransferase (ALT) 14 7 - 52 U/L   Calcium 9.1 8.6 - 10.3 mg/dL   BUN/Creatinine Ratio    CBC with Differential   Collection Time: 11/07/23  1:51 PM  Result Value Ref Range   WBC 4.70 4.40 - 11.00 10*3/uL   RBC 4.25 4.10 - 5.10 10*6/uL   Hemoglobin 14.0 12.3 - 15.3 g/dL   Hematocrit 58.3 64.0 - 44.6 %   Mean Corpuscular Volume (MCV) 98.0 (H) 80.0 - 96.0 fL   Mean Corpuscular Hemoglobin (MCH) 33.1 27.5 - 33.2 pg   Mean Corpuscular Hemoglobin Conc (MCHC) 33.7 33.0 - 37.0 g/dL   Red Cell Distribution Width (RDW) 13.8 12.3 - 17.0 %   Platelet Count (PLT) 236 150 - 450 10*3/uL   Mean Platelet Volume (MPV) 8.6 6.8 - 10.2 fL   Neutrophils % 45 %   Lymphocytes % 45 %   Monocytes % 8 %   Eosinophils % 2 %   Basophils % 1 %   nRBC % 0 %   Neutrophils Absolute 2.10 1.80 - 7.80 10*3/uL   Lymphocytes # 2.10 1.00 - 4.80 10*3/uL    Monocytes # 0.30 0.00 - 0.80 10*3/uL   Eosinophils # 0.10 0.00 - 0.50 10*3/uL   Basophils # 0.00 0.00 - 0.20 10*3/uL   nRBC Absolute 0.00 <=0.00 10*3/uL  Anemia Profile   Collection Time: 11/07/23  1:51 PM  Result Value Ref Range   Iron 324 (H) 50 - 212 ug/dL   Transferrin 750 796 - 362 mg/dL   Ferritin 82 11 - 692 ng/mL   Total Iron Binding Capacity (TIBC) 356 290 - 518 ug/dL   Transferrin Saturation 91 (H) 15 - 45 %    ASSESSMENT/PLAN   Diagnoses and all orders for this visit:  Encounter for annual physical exam Labs reviewed Pap utd  Anxiety disorder, unspecified type Pt feels she has done well wo the buspar and zoloft. Continues to have visits with the [facility].   Lumbar radiculopathy Pt has not had improvement despite nsaids/pt. Continues prn zanaflex. Defers imaging at this time.   Acute pain of left shoulder Possible biceps tendonitis or tendinopathy of the rotator cuff tendons. Discussed nsaids and PT. Pt agreeable for referral. F/u if no improvement or sx worsen.   -     Ambulatory referral to Physical Therapy; Future  Fatigue, unspecified type/Macrocytosis Discussed no anemia, nml glc and thyroid. Macrocytosis noted. Will check b12. Will also check iron panel and folate. Discussed if all normal, we can consider a sleep study.   -     Anemia Profile; Future -     Vitamin B12; Future  -     Folate; Future  Return in about 1  year (around 11/09/2024) for Annual physical.   This document serves as a record of services personally performed by Twyla Silversmith, MD.  It was created on their behalf by Inocente Fabian, CMA, a trained medical scribe, and Certified Medical Assistant (CMA). During the course of documenting the history, physical exam and medical decision making, I was functioning as a Stage manager. The creation of this record is the provider's dictation and/or activities during the visit.  Electronically signed by Inocente Fabian, CMA 11/10/2023 3:57  PM     The above note has been reviewed for accuracy.  Twyla Madelynn Silversmith, MD

## 2023-11-25 NOTE — Progress Notes (Signed)
 Physical Therapy Missed Visit Note  Payor: BCBS / Plan: BCBS OOS PPO / Product Type: PPO /     The patient did not attend therapy appointment.  Reason:  No Show  Total Missed Visits:    Patient Contact:  No Contact with patient  Future Appointments:  Patient does not have additional appointments.  Action:  Initial evaluation, patient responsible for calling to reschedule.

## 2024-03-09 ENCOUNTER — Encounter (HOSPITAL_BASED_OUTPATIENT_CLINIC_OR_DEPARTMENT_OTHER): Payer: Self-pay

## 2024-03-09 ENCOUNTER — Emergency Department (HOSPITAL_BASED_OUTPATIENT_CLINIC_OR_DEPARTMENT_OTHER): Admission: EM | Admit: 2024-03-09 | Discharge: 2024-03-09 | Disposition: A | Discharge status: Home / Self Care

## 2024-03-09 ENCOUNTER — Emergency Department (HOSPITAL_BASED_OUTPATIENT_CLINIC_OR_DEPARTMENT_OTHER)

## 2024-03-09 ENCOUNTER — Other Ambulatory Visit: Payer: Self-pay

## 2024-03-09 DIAGNOSIS — M5432 Sciatica, left side: Secondary | ICD-10-CM

## 2024-03-09 DIAGNOSIS — R0789 Other chest pain: Secondary | ICD-10-CM | POA: Insufficient documentation

## 2024-03-09 DIAGNOSIS — M5442 Lumbago with sciatica, left side: Secondary | ICD-10-CM | POA: Diagnosis not present

## 2024-03-09 DIAGNOSIS — R079 Chest pain, unspecified: Secondary | ICD-10-CM

## 2024-03-09 DIAGNOSIS — M545 Low back pain, unspecified: Secondary | ICD-10-CM | POA: Diagnosis present

## 2024-03-09 LAB — BASIC METABOLIC PANEL WITH GFR
Anion gap: 12 (ref 5–15)
BUN: 13 mg/dL (ref 6–20)
CO2: 21 mmol/L — ABNORMAL LOW (ref 22–32)
Calcium: 8.6 mg/dL — ABNORMAL LOW (ref 8.9–10.3)
Chloride: 106 mmol/L (ref 98–111)
Creatinine, Ser: 0.66 mg/dL (ref 0.44–1.00)
GFR, Estimated: >60 mL/min (ref >60)
Glucose, Bld: 109 mg/dL — ABNORMAL HIGH (ref 70–99)
Potassium: 3.6 mmol/L (ref 3.5–5.1)
Sodium: 138 mmol/L (ref 135–145)

## 2024-03-09 LAB — CBC
HCT: 36.1 % (ref 36.0–46.0)
Hemoglobin: 13.1 g/dL (ref 12.0–15.0)
MCH: 32.9 pg (ref 26.0–34.0)
MCHC: 36.3 g/dL — ABNORMAL HIGH (ref 30.0–36.0)
MCV: 90.7 fL (ref 80.0–100.0)
Platelets: 266 K/uL (ref 150–400)
RBC: 3.98 MIL/uL (ref 3.87–5.11)
RDW: 13.9 % (ref 11.5–15.5)
WBC: 6.4 K/uL (ref 4.0–10.5)
nRBC: 0 % (ref 0.0–0.2)

## 2024-03-09 LAB — TROPONIN T, HIGH SENSITIVITY
Troponin T High Sensitivity: <15 ng/L (ref 0–19)
Troponin T High Sensitivity: <15 ng/L (ref 0–19)

## 2024-03-09 LAB — D-DIMER, QUANTITATIVE: D-Dimer, Quant: <0.27 ug{FEU}/mL (ref 0.00–0.50)

## 2024-03-09 LAB — HCG, SERUM, QUALITATIVE: Preg, Serum: NEGATIVE

## 2024-03-09 MED ORDER — LIDOCAINE 5 % EX PTCH
1.0000 | MEDICATED_PATCH | CUTANEOUS | Status: DC
  Administered 2024-03-09: 1 via TRANSDERMAL
  Filled 2024-03-09: qty 1

## 2024-03-09 MED ORDER — LIDOCAINE 5 % EX PTCH: 1.0000 | MEDICATED_PATCH | CUTANEOUS | 0 refills | Status: AC

## 2024-03-09 MED ORDER — MORPHINE SULFATE (PF) 2 MG/ML IV SOLN
2.0000 mg | Freq: Once | INTRAVENOUS | Status: AC
  Administered 2024-03-09: 2 mg via INTRAVENOUS
  Filled 2024-03-09: qty 1

## 2024-03-09 MED ORDER — OXYCODONE HCL 5 MG PO TABS: 2.5000 mg | ORAL_TABLET | ORAL | 0 refills | Status: AC | PRN

## 2024-03-09 MED ORDER — KETOROLAC TROMETHAMINE 15 MG/ML IJ SOLN
15.0000 mg | Freq: Once | INTRAMUSCULAR | Status: AC
  Administered 2024-03-09: 15 mg via INTRAVENOUS
  Filled 2024-03-09: qty 1

## 2024-03-09 NOTE — Discharge Instructions (Signed)
 For pain, you can take 1000 mg of Tylenol  or 1 g of Tylenol  every 6-8 hours.  Do not exceed more than 4000 mg or 4 g in a 24-hour period.  You can also take ibuprofen  600 to 800 mg every 6-8 hours as well.  Do not take this high-dose ibuprofen  for greater than a week.  For breakthrough pain, you can take small dose of oxycodone . Do not drive with this medication.  Do note take with other sedatives.   If your chest pain worsens, please come back to the ED.  Please follow-up with a spine specialist as we discussed regarding your ongoing back pain.  He can also follow-up with physical therapy.

## 2024-03-09 NOTE — ED Notes (Signed)
 Patient transported to Ultrasound

## 2024-03-09 NOTE — ED Provider Notes (Signed)
 [place] [facility] Provider Note   CSN: [id number] Arrival date & time: 03/09/24  1800     Patient presents with: Chest Pain, Back Pain, and Leg Pain (left)   Rose Hunt is a 38 y.o. female.    Chest Pain Associated symptoms: back pain   Back Pain Associated symptoms: chest pain and leg pain   Leg Pain Associated symptoms: back pain     Patient presents because of back pain as well as chest pain as well as bilateral lower extremity swelling.   In terms of chest pain, hurts underneath the left breast.  No obvious pleuritic chest pain.  No hemoptysis.  No history of DVT or PE.  No risk factors.  No recent travel history.  No cancer diagnosis.  No hormone replacement.  No clear exertional chest pain.  No ACS history no obvious exertional component.    In terms of back pain, lower back.  More associated the left lower back.  Maybe some tingling down the left posterior aspect of the leg.  Been present for the past week or 2.  Been taking Tylenol  and ibuprofen .  Also taken Flexeril  without relief.  No bowel bladder continence.  No saddle anesthesia.  Able to ambulate.  No fever no chills.  No history of lumbar instrumentation.  No IV drug abuse.  In terms of lower extremity swelling, the leg is bilateral.  Endorses some pain in her bilateral lower extremities as well.      Prior to Admission medications   Medication Sig Start Date End Date Taking? Authorizing Provider  lidocaine  (LIDODERM ) 5 % Place 1 patch onto the skin daily. Remove & Discard patch within 12 hours or as directed by MD 03/09/24  Yes Simon Lavonia SAILOR, MD  oxyCODONE  (ROXICODONE ) 5 MG immediate release tablet Take 0.5 tablets (2.5 mg total) by mouth every 4 (four) hours as needed for severe pain (pain score 7-10). 03/09/24  Yes Simon Lavonia SAILOR, MD  HYDROcodone -acetaminophen  (NORCO/VICODIN) 5-325 MG tablet Take 1 tablet by mouth every 6 (six) hours as needed. 09/10/22   Randol Simmonds,  MD  lidocaine  (LIDODERM ) 5 % Place 1 patch onto the skin daily. Remove & Discard patch within 12 hours or as directed by MD 03/04/20   Henderly, Britni A, PA-C  metroNIDAZOLE  (FLAGYL ) 500 MG tablet Take 1 tablet (500 mg total) by mouth 2 (two) times daily. 01/04/23   Koomson, Julius, MD    Allergies: Patient has no known allergies.    Review of Systems  Cardiovascular:  Positive for chest pain.  Musculoskeletal:  Positive for back pain.    Updated Vital Signs BP (!) 137/95 (BP Location: Right Arm)   Pulse 73   Temp 97.8 F (36.6 C) (Oral)   Resp 18   Ht 5' 2 (1.575 m)   Wt 95.3 kg   LMP 02/07/2024 (Approximate) Comment: Tubal Ligation  SpO2 100%   BMI 38.41 kg/m   Physical Exam Vitals and nursing note reviewed.  Constitutional:      General: She is not in acute distress.    Appearance: She is well-developed.  HENT:     Head: Normocephalic and atraumatic.  Eyes:     Conjunctiva/sclera: Conjunctivae normal.  Cardiovascular:     Rate and Rhythm: Normal rate and regular rhythm.     Heart sounds: No murmur heard. Pulmonary:     Effort: Pulmonary effort is normal. No respiratory distress.     Breath sounds: Normal breath sounds.  Abdominal:     Palpations: Abdomen is soft.     Tenderness: There is no abdominal tenderness.  Musculoskeletal:        General: No swelling.       Arms:     Cervical back: Neck supple.  Skin:    General: Skin is warm and dry.     Capillary Refill: Capillary refill takes less than 2 seconds.  Neurological:     Mental Status: She is alert.  Psychiatric:        Mood and Affect: Mood normal.     (all labs ordered are listed, but only abnormal results are displayed) Labs Reviewed  BASIC METABOLIC PANEL WITH GFR - Abnormal; Notable for the following components:      Result Value   CO2 21 (*)    Glucose, Bld 109 (*)    Calcium 8.6 (*)    All other components within normal limits  CBC - Abnormal; Notable for the following components:   MCHC  36.3 (*)    All other components within normal limits  HCG, SERUM, QUALITATIVE  D-DIMER, QUANTITATIVE  TROPONIN T, HIGH SENSITIVITY  TROPONIN T, HIGH SENSITIVITY    EKG: EKG Interpretation Date/Time:  Friday March 09 2024 18:17:23 EDT Ventricular Rate:  83 PR Interval:  150 QRS Duration:  76 QT Interval:  351 QTC Calculation: 413 R Axis:   36  Text Interpretation: Sinus rhythm Confirmed by Simon Rea [phone]) on 03/09/2024 6:54:17 PM  Radiology: US  Venous Img Lower Bilateral (DVT) Result Date: 03/09/2024 CLINICAL DATA:  Pain and swelling EXAM: BILATERAL LOWER EXTREMITY VENOUS DOPPLER ULTRASOUND TECHNIQUE: Gray-scale sonography with graded compression, as well as color Doppler and duplex ultrasound were performed to evaluate the lower extremity deep venous systems from the level of the common femoral vein and including the common femoral, femoral, profunda femoral, popliteal and calf veins including the posterior tibial, peroneal and gastrocnemius veins when visible. The superficial great saphenous vein was also interrogated. Spectral Doppler was utilized to evaluate flow at rest and with distal augmentation maneuvers in the common femoral, femoral and popliteal veins. COMPARISON:  None Available. FINDINGS: RIGHT LOWER EXTREMITY Common Femoral Vein: No evidence of thrombus. Normal compressibility, respiratory phasicity and response to augmentation. Saphenofemoral Junction: No evidence of thrombus. Normal compressibility and flow on color Doppler imaging. Profunda Femoral Vein: No evidence of thrombus. Normal compressibility and flow on color Doppler imaging. Femoral Vein: No evidence of thrombus. Normal compressibility, respiratory phasicity and response to augmentation. Popliteal Vein: No evidence of thrombus. Normal compressibility, respiratory phasicity and response to augmentation. Calf Veins: No evidence of thrombus. Normal compressibility and flow on color Doppler imaging. Superficial  Great Saphenous Vein: No evidence of thrombus. Normal compressibility. Venous Reflux:  None. Other Findings:  None. LEFT LOWER EXTREMITY Common Femoral Vein: No evidence of thrombus. Normal compressibility, respiratory phasicity and response to augmentation. Saphenofemoral Junction: No evidence of thrombus. Normal compressibility and flow on color Doppler imaging. Profunda Femoral Vein: No evidence of thrombus. Normal compressibility and flow on color Doppler imaging. Femoral Vein: No evidence of thrombus. Normal compressibility, respiratory phasicity and response to augmentation. Popliteal Vein: No evidence of thrombus. Normal compressibility, respiratory phasicity and response to augmentation. Calf Veins: No evidence of thrombus. Normal compressibility and flow on color Doppler imaging. Superficial Great Saphenous Vein: No evidence of thrombus. Normal compressibility. Venous Reflux:  None. Other Findings:  None. IMPRESSION: No evidence of deep venous thrombosis in either lower extremity. Electronically Signed   By: Greig Maple HERO.D.  On: 03/09/2024 20:13   DG Chest 2 View Result Date: 03/09/2024 CLINICAL DATA:  Chest pain EXAM: DG CHEST 2V COMPARISON:  Chest x-ray 06/20/2018 FINDINGS: The heart size and mediastinal contours are within normal limits. Both lungs are clear. The visualized skeletal structures are unremarkable. IMPRESSION: No active cardiopulmonary disease. Electronically Signed   By: Greig Pique M.D.   On: 03/09/2024 18:58     Procedures   Medications Ordered in the ED  lidocaine  (LIDODERM ) 5 % 1 patch (1 patch Transdermal Patch Applied 03/09/24 2002)  morphine  (PF) 2 MG/ML injection 2 mg (2 mg Intravenous Given 03/09/24 1914)  ketorolac  (TORADOL ) 15 MG/ML injection 15 mg (15 mg Intravenous Given 03/09/24 2021)                                    Medical Decision Making Amount and/or Complexity of Data Reviewed Labs: ordered. Radiology: ordered.  Risk Prescription drug  management.    Patient presents because of back pain as well as chest pain as well as bilateral lower extremity swelling.   In terms of chest pain, hurts underneath the left breast.  No obvious pleuritic chest pain.  No hemoptysis.  No history of DVT or PE.  No risk factors.  No recent travel history.  No cancer diagnosis.  No hormone replacement.  No clear exertional chest pain.  No ACS history no obvious exertional component.    In terms of back pain, lower back.  More associated the left lower back.  Maybe some tingling down the left posterior aspect of the leg.  Been present for the past week or 2.  Been taking Tylenol  and ibuprofen .  Also taken Flexeril  without relief.  No bowel bladder continence.  No saddle anesthesia.  Able to ambulate.  No fever no chills.  No history of lumbar instrumentation.  No IV drug abuse.  In terms of lower extremity swelling, the leg is bilateral.  Endorses some pain in her bilateral lower extremities as well.     In terms of back pain: Strength and sensation intact in bilateral lower extremities.  Patient will sometimes have a tingling sensation down the posterior aspect of her left leg.  Consistent for probable sciatica of L5/S1 area.  But she is neurologically intact here.  No saddle anesthesia.  No bowel or bladder incontinence.  No difficulty urinating.  No concerns for any kind of central cord syndrome such as cauda equina.  No indication for emergent imaging at this point time.  Patient will follow-up with spine surgery.  Physical therapy.   In terms of lower extremity swelling, not really appreciate any kind of pitting edema.  No for swelling I can appreciate.  Did obtain DVT ultrasound bilaterally.  Unremarkable.  No evidence of DVT.  She has 2+ dorsal pedal and posterior tibial pulses.  No concerns for acute ischemic disease process.   In terms of chest pain.  EKG sinus rhythm.  Remaind on cardiac telemetry.  Interpreted by me.  Normal sinus rhythm  without STEMI arrhythmia.  Troponin negative x 2.  Low risk factors for ACS pathology.  Heart score of 0.  No further workup needed at this point time.  Given nonspecific chest pain as well.  Did obtain D-dimer.  No risk factors for DVT or PE no pleuritic chest pain or hemoptysis.  No recent travel history.  No hormone replacement or birth control use.  D-dimer undetectable.  Therefore, no further workup needed this point time given low risk.  She did have somewhat slight reproducible pain along this left chest wall.  Likely musculoskeletal in nature.   Patient discharged stable condition.       Final diagnoses:  Sciatica of left side  Chest pain, unspecified type  Acute left-sided low back pain with left-sided sciatica    ED Discharge Orders          Ordered    lidocaine  (LIDODERM ) 5 %  Every 24 hours        03/09/24 2107    oxyCODONE  (ROXICODONE ) 5 MG immediate release tablet  Every 4 hours PRN        03/09/24 2107               Simon Lavonia SAILOR, MD 03/09/24 2211

## 2024-03-09 NOTE — ED Notes (Signed)
 ED Provider at bedside.

## 2024-03-09 NOTE — ED Notes (Signed)
Patient ambulatory to restroom. Slow, steady gait.

## 2024-03-09 NOTE — ED Triage Notes (Signed)
 Arrives POV with complaints of back pain (fell 2 weeks ago), leg swelling, and left leg pain. Patient also reports chest pain that started today.
